# Patient Record
Sex: Female | Born: 1975 | Hispanic: Yes | Marital: Married | Smoking: Former smoker
Health system: Southern US, Community
[De-identification: ages and names within clinical notes are randomized; demographics above are authoritative.]

## PROBLEM LIST (undated history)

## (undated) ENCOUNTER — Inpatient Hospital Stay (HOSPITAL_COMMUNITY): Payer: Self-pay

## (undated) DIAGNOSIS — J45909 Unspecified asthma, uncomplicated: Secondary | ICD-10-CM

## (undated) DIAGNOSIS — O24419 Gestational diabetes mellitus in pregnancy, unspecified control: Secondary | ICD-10-CM

## (undated) DIAGNOSIS — E119 Type 2 diabetes mellitus without complications: Secondary | ICD-10-CM

## (undated) HISTORY — PX: NO PAST SURGERIES: SHX2092

---

## 2014-12-03 NOTE — L&D Delivery Note (Signed)
Delivery Note At 1:32 PM a viable female was delivered via  (Presentation: OA w/ compound hand, but unsure which one due to extremely rapid delivery). Moderate amount of bloody fluid passed w/ baby. Possible abruption. APGAR: 9, 9; weight pending.   Placenta status: spontaneous, intact.  Cord: 3VC with the following complications: None.  Cord pH: NA.  One moderate gush of blood after placenta. Firm w/ massage. Pitocin bolus infusing. Cytotec 1000 mcg placed rectally. FF. Scant bleeding.   Anesthesia: None Episiotomy: None Lacerations: Superficial abrasions. Hemostatic, no repair  Suture Repair: None Est. Blood Loss (mL): 306  Mom to postpartum.  Baby to Couplet care / Skin to Skin. Placenta to: BS Feeding: Breast Circ: No Contraception: Yes, but unsure what.  Sara Schwartz 05/03/2015, 2:11 PM

## 2014-12-22 ENCOUNTER — Inpatient Hospital Stay (HOSPITAL_COMMUNITY)
Admission: AD | Admit: 2014-12-22 | Discharge: 2014-12-22 | Disposition: A | Discharge status: Home / Self Care | Payer: Self-pay | Source: Ambulatory Visit | Attending: Obstetrics & Gynecology | Admitting: Obstetrics & Gynecology

## 2014-12-22 ENCOUNTER — Inpatient Hospital Stay (HOSPITAL_COMMUNITY): Payer: Self-pay

## 2014-12-22 ENCOUNTER — Encounter (HOSPITAL_COMMUNITY): Payer: Self-pay | Admitting: *Deleted

## 2014-12-22 DIAGNOSIS — Z3A20 20 weeks gestation of pregnancy: Secondary | ICD-10-CM | POA: Insufficient documentation

## 2014-12-22 DIAGNOSIS — Z87891 Personal history of nicotine dependence: Secondary | ICD-10-CM | POA: Insufficient documentation

## 2014-12-22 DIAGNOSIS — O9989 Other specified diseases and conditions complicating pregnancy, childbirth and the puerperium: Secondary | ICD-10-CM | POA: Insufficient documentation

## 2014-12-22 DIAGNOSIS — O26899 Other specified pregnancy related conditions, unspecified trimester: Secondary | ICD-10-CM | POA: Insufficient documentation

## 2014-12-22 DIAGNOSIS — R109 Unspecified abdominal pain: Secondary | ICD-10-CM | POA: Insufficient documentation

## 2014-12-22 DIAGNOSIS — M545 Low back pain: Secondary | ICD-10-CM | POA: Insufficient documentation

## 2014-12-22 DIAGNOSIS — N939 Abnormal uterine and vaginal bleeding, unspecified: Secondary | ICD-10-CM

## 2014-12-22 HISTORY — DX: Unspecified asthma, uncomplicated: J45.909

## 2014-12-22 LAB — URINE MICROSCOPIC-ADD ON

## 2014-12-22 LAB — URINALYSIS, ROUTINE W REFLEX MICROSCOPIC
Bilirubin Urine: NEGATIVE
Glucose, UA: NEGATIVE mg/dL
Hgb urine dipstick: NEGATIVE
Ketones, ur: NEGATIVE mg/dL
Nitrite: NEGATIVE
Protein, ur: NEGATIVE mg/dL
Specific Gravity, Urine: 1.01 (ref 1.005–1.030)
Urobilinogen, UA: 0.2 mg/dL (ref 0.0–1.0)
pH: 6.5 (ref 5.0–8.0)

## 2014-12-22 NOTE — MAU Provider Note (Signed)
History     CSN: [id number]  Arrival date and time: 12/22/14 1249   First Provider Initiated Contact with Patient 12/22/14 1359      Chief Complaint  Patient presents with  . Abdominal Pain  . Back Pain   HPI 39 yo female G7P5106 at 7442w2d presents with lower abdominal pain. Pain started last night. Pain begins in low back and radiates around lower abdomen. Admits to contractions q605mins. Denies strenuous activity or trauma.   Denies LOF, contractions, bleeding, decreased fetal activity.   Past Medical History  Diagnosis Date  . Asthma     Past Surgical History  Procedure Laterality Date  . Vaginal delivery      multiple    History reviewed. No pertinent family history.  History  Substance Use Topics  . Smoking status: Former Smoker    Quit date: 12/22/2012  . Smokeless tobacco: Not on file  . Alcohol Use: No    Allergies: No Known Allergies  Prescriptions prior to admission  Medication Sig Dispense Refill Last Dose  . albuterol (PROVENTIL HFA;VENTOLIN HFA) 108 (90 BASE) MCG/ACT inhaler Inhale 1-2 puffs into the lungs every 6 (six) hours as needed for wheezing or shortness of breath.   Past Week at Unknown time    Review of Systems  Constitutional: Negative for fever and chills.  Gastrointestinal: Positive for abdominal pain. Negative for nausea and vomiting.  Genitourinary: Negative for dysuria, urgency, frequency, hematuria and flank pain.  Neurological: Negative for headaches.   Physical Exam   Blood pressure 138/77, pulse 111, temperature 100 F (37.8 C), temperature source Oral, resp. rate 18.  Physical Exam  Constitutional: She is oriented to person, place, and time. She appears well-developed and well-nourished.  HENT:  Head: Normocephalic and atraumatic.  Neck: Neck supple.  Cardiovascular: Normal rate, regular rhythm and normal heart sounds.   Respiratory: Effort normal and breath sounds normal.  Musculoskeletal:  Tenderness to palpation of  bilateral SI joints  Neurological: She is alert and oriented to person, place, and time.    MAU Course  Procedures  Assessment and Plan  39 yo female G7P5106 at 1342w2d presents with low back and abdominal pain.  # low back pain that radiates to abdomen- considered pre-term labor because pt admits to contractions every 5 mins. However, physical exam is most consistent with msk low back pain. - U/S to examine placenta  # Dispo- discharge home with regular follow up  Rolm BookbinderMoss, Amber 12/22/2014, 1:59 PM   OB fellow attestation:  I have seen and examined this patient; I agree with above documentation in the resident's note.   Sara Schwartz is a 39 y.o. [phone] reporting lower abdominal pain, contractions q105min +FM, denies LOF, VB, contractions, vaginal discharge.  PE: BP 117/78 mmHg  Pulse 106  Temp(Src) 100 F (37.8 C) (Oral)  Resp 18 Gen: calm comfortable, NAD Resp: normal effort, no distress Abd: gravid Musculoskeletal: SI joints ttp Dilation: Closed Effacement (%): Thick No contractions during exam/interview  ROS, labs, PMH reviewed  Results for Sara LowerLOPEZ-CRUZ, Sara Schwartz (MRN [id number]) as of 01/05/2015 19:33  Ref. Range 12/22/2014 13:10  Color, Urine Latest Range: YELLOW  YELLOW  APPearance Latest Range: CLEAR  CLEAR  Specific Gravity, Urine Latest Range: 1.005-1.030  1.010  pH Latest Range: 5.0-8.0  6.5  Glucose Latest Range: NEGATIVE mg/dL NEGATIVE  Bilirubin Urine Latest Range: NEGATIVE  NEGATIVE  Ketones, ur Latest Range: NEGATIVE mg/dL NEGATIVE  Protein Latest Range: NEGATIVE mg/dL NEGATIVE  Urobilinogen, UA Latest Range: 0.0-1.0 mg/dL 0.2  Nitrite Latest Range: NEGATIVE  NEGATIVE  Leukocytes, UA Latest Range: NEGATIVE  TRACE (A)  Hgb urine dipstick Latest Range: NEGATIVE  NEGATIVE  WBC, UA Latest Range: <3 WBC/hpf 0-2  Squamous Epithelial / LPF Latest Range: RARE  FEW (A)  Bacteria, UA Latest Range: RARE  RARE    Plan: - pts complaints concerning for  preterm labor, however physical exam consistent with musculoskeletal lower back pain - CL 3.66 cm on sono, reassuring  Perry Mount, MD 7:31 PM

## 2014-12-22 NOTE — MAU Note (Signed)
States pain starts in lower back, then moves around to lower abd.

## 2014-12-22 NOTE — Progress Notes (Signed)
I assisted Susan, RN with questions.  Eda H Royal  Interpreter. °

## 2014-12-22 NOTE — MAU Note (Signed)
[facility]

## 2014-12-22 NOTE — Discharge Instructions (Signed)
Keep your scheduled appointment for prenatal care. °

## 2014-12-22 NOTE — MAU Note (Signed)
Uc's started last Friday but stopped, but started again last night.  Lower abd & back pain.  Denies bleeding or LOF.

## 2014-12-23 ENCOUNTER — Other Ambulatory Visit (HOSPITAL_COMMUNITY): Payer: Self-pay | Admitting: Nurse Practitioner

## 2014-12-23 DIAGNOSIS — O09522 Supervision of elderly multigravida, second trimester: Secondary | ICD-10-CM

## 2014-12-23 DIAGNOSIS — Z3689 Encounter for other specified antenatal screening: Secondary | ICD-10-CM

## 2014-12-23 LAB — GLUCOSE TOLERANCE, 1 HOUR (50G) W/O FASTING: Glucose, GTT - 1 Hour: 139 mg/dL (ref ?–200)

## 2014-12-23 LAB — OB RESULTS CONSOLE ABO/RH: RH TYPE: POSITIVE

## 2014-12-23 LAB — GC/CHLAMYDIA PROBE AMP (~~LOC~~) NOT AT ARMC
CHLAMYDIA, DNA PROBE: NEGATIVE
Neisseria Gonorrhea: NEGATIVE

## 2014-12-23 LAB — OB RESULTS CONSOLE HIV ANTIBODY (ROUTINE TESTING): HIV: NONREACTIVE

## 2014-12-23 LAB — CYSTIC FIBROSIS DIAGNOSTIC STUDY: INTERPRETATION-CFDNA: NEGATIVE

## 2014-12-23 LAB — OB RESULTS CONSOLE GC/CHLAMYDIA
CHLAMYDIA, DNA PROBE: NEGATIVE
Gonorrhea: NEGATIVE

## 2014-12-23 LAB — OB RESULTS CONSOLE VARICELLA ZOSTER ANTIBODY, IGG: Varicella: IMMUNE

## 2014-12-23 LAB — OB RESULTS CONSOLE RUBELLA ANTIBODY, IGM: RUBELLA: IMMUNE

## 2014-12-23 LAB — CULTURE, OB URINE

## 2014-12-23 LAB — CYTOLOGY - PAP

## 2014-12-23 LAB — SICKLE CELL SCREEN: SICKLE CELL SCREEN: NORMAL

## 2014-12-23 LAB — OB RESULTS CONSOLE ANTIBODY SCREEN: Antibody Screen: NEGATIVE

## 2014-12-23 LAB — OB RESULTS CONSOLE PLATELET COUNT: Platelets: 231 10*3/uL

## 2014-12-23 LAB — OB RESULTS CONSOLE HEPATITIS B SURFACE ANTIGEN: HEP B S AG: NEGATIVE

## 2014-12-23 LAB — OB RESULTS CONSOLE RPR: RPR: NONREACTIVE

## 2014-12-23 LAB — DRUG SCREEN, URINE: Drug Screen, Urine: NEGATIVE

## 2014-12-26 ENCOUNTER — Inpatient Hospital Stay (HOSPITAL_COMMUNITY)
Admission: AD | Admit: 2014-12-26 | Discharge: 2014-12-27 | Disposition: A | Discharge status: Home / Self Care | Payer: Self-pay | Source: Ambulatory Visit | Attending: Obstetrics and Gynecology | Admitting: Obstetrics and Gynecology

## 2014-12-26 ENCOUNTER — Encounter (HOSPITAL_COMMUNITY): Payer: Self-pay | Admitting: *Deleted

## 2014-12-26 DIAGNOSIS — R109 Unspecified abdominal pain: Secondary | ICD-10-CM | POA: Insufficient documentation

## 2014-12-26 DIAGNOSIS — O9989 Other specified diseases and conditions complicating pregnancy, childbirth and the puerperium: Secondary | ICD-10-CM | POA: Insufficient documentation

## 2014-12-26 DIAGNOSIS — M549 Dorsalgia, unspecified: Secondary | ICD-10-CM | POA: Insufficient documentation

## 2014-12-26 DIAGNOSIS — Z3A21 21 weeks gestation of pregnancy: Secondary | ICD-10-CM | POA: Insufficient documentation

## 2014-12-26 NOTE — MAU Note (Signed)
Pt reports that since 5pm she has had really bad back pain. Took tylenol without relief. States she is also having pressure in her lower abd.

## 2014-12-27 LAB — URINALYSIS, ROUTINE W REFLEX MICROSCOPIC
Bilirubin Urine: NEGATIVE
Glucose, UA: NEGATIVE mg/dL
Ketones, ur: NEGATIVE mg/dL
NITRITE: NEGATIVE
PH: 6 (ref 5.0–8.0)
Protein, ur: NEGATIVE mg/dL
Specific Gravity, Urine: <1.005 — ABNORMAL LOW (ref 1.005–1.030)
Urobilinogen, UA: 0.2 mg/dL (ref 0.0–1.0)

## 2014-12-27 LAB — URINE MICROSCOPIC-ADD ON

## 2014-12-27 MED ORDER — OXYCODONE-ACETAMINOPHEN 5-325 MG PO TABS: 1.0000 | ORAL_TABLET | Freq: Once | ORAL | Status: DC

## 2014-12-27 MED ORDER — OXYCODONE-ACETAMINOPHEN 5-325 MG PO TABS
2.0000 | ORAL_TABLET | Freq: Once | ORAL | Status: AC
  Administered 2014-12-27: 2 via ORAL
  Filled 2014-12-27: qty 2

## 2014-12-27 NOTE — MAU Note (Signed)
No s/s of adverse effects of medication. Patient discharging home with significant other.

## 2014-12-27 NOTE — MAU Provider Note (Signed)
Chief Complaint:  Back Pain and Abdominal Pain   First Provider Initiated Contact with Patient 12/27/14 0038      HPI: Sara Schwartz is a 39 y.o. G7P5106 at 1848w0d who presents to maternity admissions reporting low back and pelvic pain. Pain has been ongoing for approximately 7-10days it is progressive in nature. Localized to the lower quadrants and SI joints. Patient states that pain is becoming intolerable. She states she has experienced pain like this with prior pregnancies but not as severe and not as early in the pregnancy. Denies fevers, chills, n/v/d/c, weakness, numbness, bowel/bladder incontinence.  Denies contractions, leakage of fluid or vaginal bleeding.  Patient was seen on 12/22/14 for similar issues.  Past Medical History: Past Medical History  Diagnosis Date  . Asthma     Past obstetric history: OB History  Gravida Para Term Preterm AB SAB TAB Ectopic Multiple Living  7 6 5 1      6     # Outcome Date GA Lbr Len/2nd Weight Sex Delivery Anes PTL Lv  7 Current           6 Term           5 Term           4 Term           3 Term           2 Term           1 Preterm               Past Surgical History: Past Surgical History  Procedure Laterality Date  . Vaginal delivery      multiple     Family History: No family history on file.  Social History: History  Substance Use Topics  . Smoking status: Never Smoker   . Smokeless tobacco: Never Used  . Alcohol Use: No     Comment: socially when not pregnant    Allergies: No Known Allergies  Meds:  Prescriptions prior to admission  Medication Sig Dispense Refill Last Dose  . albuterol (PROVENTIL HFA;VENTOLIN HFA) 108 (90 BASE) MCG/ACT inhaler Inhale 1-2 puffs into the lungs every 6 (six) hours as needed for wheezing or shortness of breath.   Past Week at Unknown time    ROS: Pertinent findings in history of present illness.  Physical Exam  Blood pressure 129/71, pulse 98, temperature 98.3 F (36.8  C), temperature source Oral, resp. rate 16, height 5\' 4"  (1.626 m), weight 86.183 kg (190 lb), SpO2 100 %. GENERAL: Well-developed, well-nourished female in no acute distress.  HEENT: normocephalic, EOMI, PERRLA HEART: normal rate, rhythm RESP: normal effort, CTAB ABDOMEN: Soft, non-tender, gravid appropriate for gestational age; no significant TTP EXTREMITIES: Nontender, no edema; straight leg raise negative, sensation intact bilat, strength 5/5 bilat NEURO: alert and oriented  Dilation: Fingertip Effacement (%): Thick Cervical Position: Posterior Exam by:: Mckeag, MD   Labs: Results for orders placed or performed during the hospital encounter of 12/26/14 (from the past 24 hour(s))  Urinalysis, Routine w reflex microscopic     Status: Abnormal   Collection Time: 12/26/14 11:40 PM  Result Value Ref Range   Color, Urine YELLOW YELLOW   APPearance CLEAR CLEAR   Specific Gravity, Urine <1.005 (L) 1.005 - 1.030   pH 6.0 5.0 - 8.0   Glucose, UA NEGATIVE NEGATIVE mg/dL   Hgb urine dipstick TRACE (A) NEGATIVE   Bilirubin Urine NEGATIVE NEGATIVE   Ketones, ur NEGATIVE NEGATIVE mg/dL  Protein, ur NEGATIVE NEGATIVE mg/dL   Urobilinogen, UA 0.2 0.0 - 1.0 mg/dL   Nitrite NEGATIVE NEGATIVE   Leukocytes, UA MODERATE (A) NEGATIVE  Urine microscopic-add on     Status: Abnormal   Collection Time: 12/26/14 11:40 PM  Result Value Ref Range   Squamous Epithelial / LPF FEW (A) RARE   WBC, UA 7-10 <3 WBC/hpf   RBC / HPF 3-6 <3 RBC/hpf   Bacteria, UA FEW (A) RARE    Imaging:  US Ob Limited  12/22/2014   OBSTETRICAL ULTRASOUND: This exam was performed within a [place] [facility]. The OB US report was generated in the AS system, and faxed to the ordering physician.   This report is available in the [employer]. See the AS Obstetric US report via the Image Link.  Assessment: Likely Ligamental stretching 2/2 pregnancy, could be multifactorial w/ muscle strain. Differential includes  muscle strain, sciatica, constipation. 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 Sciatica unlikely due to negative straight leg raise and no pain down her legs whatsoever. Constipation is unlikely due to the patient reporting having a BM earlier this morning.   Plan: Percocet 5/325 x10tablets for pain Discharge home Labor precautions and fetal kick counts F/u w/ OBGYN    Medication List    ASK your doctor about these medications        albuterol 108 (90 BASE) MCG/ACT inhaler  Commonly known as:  PROVENTIL HFA;VENTOLIN HFA  Inhale 1-2 puffs into the lungs every 6 (six) hours as needed for wheezing or shortness of breath.        Kathee Delton, MD 12/27/2014 1:01 AM

## 2014-12-29 ENCOUNTER — Ambulatory Visit (HOSPITAL_COMMUNITY)
Admission: RE | Admit: 2014-12-29 | Discharge: 2014-12-29 | Disposition: A | Discharge status: Home / Self Care | Payer: Self-pay | Source: Ambulatory Visit | Attending: Nurse Practitioner | Admitting: Nurse Practitioner

## 2014-12-29 DIAGNOSIS — Z36 Encounter for antenatal screening of mother: Secondary | ICD-10-CM | POA: Insufficient documentation

## 2014-12-29 DIAGNOSIS — Z3689 Encounter for other specified antenatal screening: Secondary | ICD-10-CM | POA: Insufficient documentation

## 2014-12-29 DIAGNOSIS — O09529 Supervision of elderly multigravida, unspecified trimester: Secondary | ICD-10-CM | POA: Insufficient documentation

## 2014-12-29 DIAGNOSIS — O09212 Supervision of pregnancy with history of pre-term labor, second trimester: Secondary | ICD-10-CM | POA: Insufficient documentation

## 2014-12-29 DIAGNOSIS — O24410 Gestational diabetes mellitus in pregnancy, diet controlled: Secondary | ICD-10-CM | POA: Insufficient documentation

## 2014-12-29 DIAGNOSIS — O09522 Supervision of elderly multigravida, second trimester: Secondary | ICD-10-CM | POA: Insufficient documentation

## 2014-12-29 DIAGNOSIS — Z3A21 21 weeks gestation of pregnancy: Secondary | ICD-10-CM | POA: Insufficient documentation

## 2014-12-29 LAB — GLUCOSE, 3 HOUR GESTATIONAL
GLUCOSE 1 HOUR GTT: 175 mg/dL (ref ?–200)
GLUCOSE 2 HOUR GTT: 167 mg/dL — AB (ref ?–140)
GLUCOSE 3 HOUR GTT: 137 mg/dL (ref ?–140)
GLUCOSE FASTING: 115 mg/dL — AB (ref 60–109)

## 2015-01-03 ENCOUNTER — Telehealth: Payer: Self-pay

## 2015-01-03 NOTE — Telephone Encounter (Signed)
-----   Message from Perry MountKristy Rocio Acosta, MD sent at 12/31/2014  8:36 PM EST ----- Does this patient receive prenatal care somewhere?  Got result of sono in my inbasket but I am not sure where she is getting her care.  She needs anatomy sono and to establish care.  Perry MountACOSTA,KRISTY ROCIO, MD     ----- Message -----    From: Rad Results In Interface    Sent: 12/22/2014   4:35 PM      To: Perry MountKristy Rocio Acosta, MD

## 2015-01-03 NOTE — Telephone Encounter (Signed)
Patient has initial visit with Sara Schwartz on 01/10/14 at 0805-- [facility] Called patient with spanish interpreter Laveda NormanBlanca Linder to inform her of appointment date, time and location with Sara Schwartz. Patient verbalized understanding and gratitude. No questions or concerns.

## 2015-01-10 ENCOUNTER — Encounter: Payer: Self-pay | Admitting: Obstetrics and Gynecology

## 2015-01-10 ENCOUNTER — Encounter: Payer: Self-pay | Attending: Obstetrics and Gynecology | Admitting: *Deleted

## 2015-01-10 ENCOUNTER — Ambulatory Visit (INDEPENDENT_AMBULATORY_CARE_PROVIDER_SITE_OTHER): Payer: Self-pay | Admitting: Obstetrics and Gynecology

## 2015-01-10 VITALS — BP 110/62 | HR 79 | Temp 97.3°F | Wt 184.6 lb

## 2015-01-10 DIAGNOSIS — O09529 Supervision of elderly multigravida, unspecified trimester: Secondary | ICD-10-CM | POA: Insufficient documentation

## 2015-01-10 DIAGNOSIS — O09892 Supervision of other high risk pregnancies, second trimester: Secondary | ICD-10-CM

## 2015-01-10 DIAGNOSIS — IMO0001 Reserved for inherently not codable concepts without codable children: Secondary | ICD-10-CM

## 2015-01-10 DIAGNOSIS — O24419 Gestational diabetes mellitus in pregnancy, unspecified control: Secondary | ICD-10-CM

## 2015-01-10 DIAGNOSIS — O09219 Supervision of pregnancy with history of pre-term labor, unspecified trimester: Secondary | ICD-10-CM

## 2015-01-10 DIAGNOSIS — N870 Mild cervical dysplasia: Secondary | ICD-10-CM | POA: Insufficient documentation

## 2015-01-10 DIAGNOSIS — Z713 Dietary counseling and surveillance: Secondary | ICD-10-CM | POA: Insufficient documentation

## 2015-01-10 DIAGNOSIS — O3442 Maternal care for other abnormalities of cervix, second trimester: Secondary | ICD-10-CM

## 2015-01-10 DIAGNOSIS — J4520 Mild intermittent asthma, uncomplicated: Secondary | ICD-10-CM

## 2015-01-10 DIAGNOSIS — O09522 Supervision of elderly multigravida, second trimester: Secondary | ICD-10-CM

## 2015-01-10 DIAGNOSIS — O99519 Diseases of the respiratory system complicating pregnancy, unspecified trimester: Secondary | ICD-10-CM

## 2015-01-10 DIAGNOSIS — O9989 Other specified diseases and conditions complicating pregnancy, childbirth and the puerperium: Secondary | ICD-10-CM

## 2015-01-10 DIAGNOSIS — J45909 Unspecified asthma, uncomplicated: Secondary | ICD-10-CM

## 2015-01-10 DIAGNOSIS — O09212 Supervision of pregnancy with history of pre-term labor, second trimester: Secondary | ICD-10-CM

## 2015-01-10 DIAGNOSIS — O09899 Supervision of other high risk pregnancies, unspecified trimester: Secondary | ICD-10-CM | POA: Insufficient documentation

## 2015-01-10 DIAGNOSIS — R87619 Unspecified abnormal cytological findings in specimens from cervix uteri: Secondary | ICD-10-CM

## 2015-01-10 LAB — CBC
HCT: 30.8 % — ABNORMAL LOW (ref 36.0–46.0)
Hemoglobin: 9.6 g/dL — ABNORMAL LOW (ref 12.0–15.0)
MCH: 22.6 pg — ABNORMAL LOW (ref 26.0–34.0)
MCHC: 31.2 g/dL (ref 30.0–36.0)
MCV: 72.5 fL — ABNORMAL LOW (ref 78.0–100.0)
MPV: 9.3 fL (ref 8.6–12.4)
Platelets: 393 10*3/uL (ref 150–400)
RBC: 4.25 MIL/uL (ref 3.87–5.11)
RDW: 19 % — ABNORMAL HIGH (ref 11.5–15.5)
WBC: 8.9 10*3/uL (ref 4.0–10.5)

## 2015-01-10 LAB — POCT URINALYSIS DIP (DEVICE)
Bilirubin Urine: NEGATIVE
Glucose, UA: NEGATIVE mg/dL
Ketones, ur: NEGATIVE mg/dL
NITRITE: NEGATIVE
PROTEIN: NEGATIVE mg/dL
SPECIFIC GRAVITY, URINE: 1.02 (ref 1.005–1.030)
UROBILINOGEN UA: 0.2 mg/dL (ref 0.0–1.0)
pH: 7.5 (ref 5.0–8.0)

## 2015-01-10 MED ORDER — ACCU-CHEK FASTCLIX LANCETS MISC: 1.0000 [IU] | Freq: Four times a day (QID) | Status: DC

## 2015-01-10 MED ORDER — ASPIRIN 81 MG PO TABS: 81.0000 mg | ORAL_TABLET | Freq: Every day | ORAL | Status: DC

## 2015-01-10 MED ORDER — GLUCOSE BLOOD VI STRP: ORAL_STRIP | Status: DC

## 2015-01-10 MED ORDER — PRENATAL VITAMINS 0.8 MG PO TABS: 1.0000 | ORAL_TABLET | Freq: Every day | ORAL | Status: DC

## 2015-01-10 MED ORDER — ACCU-CHEK NANO SMARTVIEW W/DEVICE KIT: 1.0000 | PACK | Freq: Four times a day (QID) | Status: DC

## 2015-01-10 NOTE — Progress Notes (Signed)
  Patient was seen on 01/10/15 for Gestational Diabetes self-management . The following learning objectives were met by the patient : She will meet with Dietitian next visit. She needs to be at work by 10:00.   States the definition of Gestational Diabetes  States why dietary management is important in controlling blood glucose  Describes the effects of carbohydrates on blood glucose levels  Demonstrates carbohydrate counting   States when to check blood glucose levels  Demonstrates proper blood glucose monitoring techniques  States the effect of stress and exercise on blood glucose levels  States the importance of limiting caffeine and abstaining from alcohol and smoking  Plan:  Be aware of the carbohydrates you are eating. Eat about 1/2 of the beans, rice and tortilla that she normally eats. Consider  increasing your activity level by walking daily as tolerated Begin checking BG before breakfast and 2 hours after first bit of breakfast, lunch and dinner after  as directed by MD  Take medication  as directed by MD  Blood glucose monitor given: [employer] Lot # [id number] Exp: 2017/01/30 Blood glucose reading: 132m/dl  Patient instructed to monitor glucose levels: FBS: 60 - <90 2 hour: <120  Patient received the following handouts:  Nutrition Diabetes and Pregnancy  Carbohydrate Counting List  Patient will be seen for follow-up as needed.

## 2015-01-10 NOTE — Addendum Note (Signed)
Addended by: Rosendo GrosHALPIN, Lener Ventresca L on: 01/10/2015 09:48 AM   Modules accepted: Orders

## 2015-01-10 NOTE — Addendum Note (Signed)
Addended by: Catalina AntiguaONSTANT, [place] Phifer on: 01/10/2015 10:13 AM   Modules accepted: Orders

## 2015-01-10 NOTE — Progress Notes (Signed)
Growth U/S 01/24/15 @ 115p with Radiology.  [facility] app[facility] 02/03/15 @ 830a pt aware $115.00 due at time of appt.  Fetal Echo with Dr. Elizebeth Brookingotton 02/03/15 @ 1030a, pt aware $100.00 due at time of appt.  Spanish interpreter Laveda NormanBlanca Linder present.

## 2015-01-10 NOTE — Progress Notes (Signed)
Reports decreased appetite Okey RegalCarol used for interpreter Needs Rx for PNV and needs testing supplies

## 2015-01-10 NOTE — Progress Notes (Signed)
Patient transferred from HD secondary to gestational diabetes and h/o preterm delivery. Discussed the meaning of GDM and its potential complications in pregnancy including but not limited to risks of fetal macrosomia, associated labor dystocia, and potential for fetal death. Will send to diabetic educator for teaching. Obtain baseline labs and refer for fetal echo and eye exam Patient [place] h/o preterm [place] at 2534, 29 and 36 weeks. Discussed benefits of 17-P in decreasing risks of PTD- Patient desires to discuss with her husband Patient with abnormal pap smear- LSIL- will schedule for colposcopy

## 2015-01-12 ENCOUNTER — Encounter: Payer: Self-pay | Admitting: *Deleted

## 2015-01-12 DIAGNOSIS — Z283 Underimmunization status: Secondary | ICD-10-CM | POA: Insufficient documentation

## 2015-01-12 DIAGNOSIS — Z2839 Other underimmunization status: Secondary | ICD-10-CM | POA: Insufficient documentation

## 2015-01-12 DIAGNOSIS — O0932 Supervision of pregnancy with insufficient antenatal care, second trimester: Secondary | ICD-10-CM

## 2015-01-12 DIAGNOSIS — Z23 Encounter for immunization: Secondary | ICD-10-CM | POA: Insufficient documentation

## 2015-01-12 DIAGNOSIS — Z789 Other specified health status: Secondary | ICD-10-CM | POA: Insufficient documentation

## 2015-01-12 DIAGNOSIS — O0930 Supervision of pregnancy with insufficient antenatal care, unspecified trimester: Secondary | ICD-10-CM | POA: Insufficient documentation

## 2015-01-12 DIAGNOSIS — O9989 Other specified diseases and conditions complicating pregnancy, childbirth and the puerperium: Secondary | ICD-10-CM

## 2015-01-13 ENCOUNTER — Encounter: Payer: Self-pay | Admitting: Obstetrics & Gynecology

## 2015-01-17 ENCOUNTER — Ambulatory Visit: Payer: Self-pay

## 2015-01-24 ENCOUNTER — Ambulatory Visit (HOSPITAL_COMMUNITY): Payer: Self-pay

## 2015-01-24 ENCOUNTER — Encounter: Payer: Self-pay | Admitting: Obstetrics & Gynecology

## 2015-01-26 ENCOUNTER — Ambulatory Visit: Payer: Self-pay

## 2015-01-26 ENCOUNTER — Ambulatory Visit (HOSPITAL_COMMUNITY)
Admission: RE | Admit: 2015-01-26 | Discharge: 2015-01-26 | Disposition: A | Discharge status: Home / Self Care | Payer: Self-pay | Source: Ambulatory Visit | Attending: Obstetrics and Gynecology | Admitting: Obstetrics and Gynecology

## 2015-01-26 DIAGNOSIS — O09522 Supervision of elderly multigravida, second trimester: Secondary | ICD-10-CM | POA: Insufficient documentation

## 2015-01-26 DIAGNOSIS — Z3A25 25 weeks gestation of pregnancy: Secondary | ICD-10-CM | POA: Insufficient documentation

## 2015-01-26 DIAGNOSIS — O24410 Gestational diabetes mellitus in pregnancy, diet controlled: Secondary | ICD-10-CM | POA: Insufficient documentation

## 2015-01-31 ENCOUNTER — Telehealth: Payer: Self-pay | Admitting: *Deleted

## 2015-01-31 NOTE — Telephone Encounter (Addendum)
Pt called and stated that she now has medicaid and needs a meter and strips and lancets sent to her pharmacy [facility]

## 2015-01-31 NOTE — Telephone Encounter (Signed)
Called patient with pacific interpreter [phone], no answer. Left message stating we are trying to return your call and to let you know we have already sent in that prescription to your pharmacy. Please call us back at the clinics if you have any questions or problems getting your prescription

## 2015-02-01 ENCOUNTER — Other Ambulatory Visit: Payer: Self-pay

## 2015-02-01 MED ORDER — GLUCOSE BLOOD VI STRP: ORAL_STRIP | Status: DC

## 2015-02-01 NOTE — Progress Notes (Signed)
Pt called the front desk and stated to Beronica  that she went to go pick her meter and was told that Medicaid does not cover the meter that was ordered.  I called pt [facility] pharmacy and spoke with pharmacy tech and clarified the prescription that was ordered.  Pharmacy tech stated that the correct meter was ordered but there was no instructions for the test strips.  I informed tech that I would resubmit the order for the test strips and that the pt will bring in with her the new Medicaid card.  Beronica informed pt to bring Medicaid card to pharmacy to pick up her meter and test strips.  Pt agreed.

## 2015-02-08 ENCOUNTER — Telehealth: Payer: Self-pay

## 2015-02-08 ENCOUNTER — Encounter (HOSPITAL_COMMUNITY): Payer: Self-pay | Admitting: General Practice

## 2015-02-08 ENCOUNTER — Inpatient Hospital Stay (HOSPITAL_COMMUNITY)
Admission: AD | Admit: 2015-02-08 | Discharge: 2015-02-08 | Disposition: A | Discharge status: Home / Self Care | Payer: Self-pay | Source: Ambulatory Visit | Attending: Family Medicine | Admitting: Family Medicine

## 2015-02-08 DIAGNOSIS — K047 Periapical abscess without sinus: Secondary | ICD-10-CM | POA: Insufficient documentation

## 2015-02-08 DIAGNOSIS — Z3A27 27 weeks gestation of pregnancy: Secondary | ICD-10-CM | POA: Insufficient documentation

## 2015-02-08 DIAGNOSIS — O9989 Other specified diseases and conditions complicating pregnancy, childbirth and the puerperium: Secondary | ICD-10-CM | POA: Insufficient documentation

## 2015-02-08 HISTORY — DX: Type 2 diabetes mellitus without complications: E11.9

## 2015-02-08 HISTORY — DX: Gestational diabetes mellitus in pregnancy, unspecified control: O24.419

## 2015-02-08 MED ORDER — AMOXICILLIN-POT CLAVULANATE 875-125 MG PO TABS: 1.0000 | ORAL_TABLET | Freq: Two times a day (BID) | ORAL | Status: DC

## 2015-02-08 MED ORDER — ACETAMINOPHEN-CODEINE #3 300-30 MG PO TABS: 1.0000 | ORAL_TABLET | Freq: Four times a day (QID) | ORAL | Status: DC | PRN

## 2015-02-08 MED ORDER — LACTATED RINGERS IV BOLUS (SEPSIS)
1000.0000 mL | Freq: Once | INTRAVENOUS | Status: AC
  Administered 2015-02-08: 1000 mL via INTRAVENOUS

## 2015-02-08 MED ORDER — OXYCODONE-ACETAMINOPHEN 5-325 MG PO TABS: 1.0000 | ORAL_TABLET | Freq: Four times a day (QID) | ORAL | Status: DC | PRN

## 2015-02-08 MED ORDER — SODIUM CHLORIDE 0.9 % IV SOLN
3.0000 g | Freq: Four times a day (QID) | INTRAVENOUS | Status: DC
  Administered 2015-02-08: 3 g via INTRAVENOUS
  Filled 2015-02-08 (×3): qty 3

## 2015-02-08 NOTE — Telephone Encounter (Signed)
Patient walked into clinic today c/o of tooth ache and swelling. Upon inspection patient's right cheek very swollen--. Called [facility]-- they stated given patient's swelling they recommend she get a dose of IV Abx as they would not be able to do any work until swelling decreased. They state they can fit her in for an emergency visit tomorrow at 0930 02/09/15. Patient needs to call them to secure appointment ASAP-- also state she should arrive at 0800 tomorrow as they may be able to see her sooner. Discussed with Caren Griffinseirdre Poe, CNM who recomends patient be sent to MAU for dose of IB ABX. Report called to RN. Deirdre Christy GentlesPoe, CNM called report to resident. Patient walked up to MAU. Patient also given appointment information for tomorrow. Patient verbalized understanding and gratitude. No questions or concerns.

## 2015-02-08 NOTE — Discharge Instructions (Signed)
Absceso dental  (Abscessed Tooth)  Un absceso dental es una infeccin alrededor de un diente. Las causas pueden ser cavidades o lesiones en un diente (caries) o una enfermedad dental. El absceso dental causa dolor alrededor del diente que puede ser leve o intenso. Consulte al dentista inmediatamente si tiene dolor en un diente o en la enca.  CUIDADOS EN EL HOGAR   Tome los medicamentos segn las indicaciones. Finalice la prescripcin completa, aunque se sienta mejor.  No conduzca automviles luego de tomar analgsicos.  Enjuguese la boca con frecuencia (haga buches) con agua y sal ( de cucharadita de sal en 8 onzas [240 ml] de agua tibia).  No aplique calor en la parte externa del rostro. SOLICITE AYUDA DE INMEDIATO SI:   La temperatura oral le sube a ms de 38,9 C (102 F) y no puede controlarla con medicamentos.  Siente escalofros o un dolor de cabeza muy intenso.  Tiene problemas para respirar o tragar.  No puede abrir Government social research officerla boca.  Observa inflamacin (hinchazn) en el cuello o alrededor del ojo.  El dolor no se alivia con los [employer] Parcelmedicamentos.  El dolor empeora en vez de [place]jorar. ASEGRESE DE QUE:   Comprende estas instrucciones.  Controlar su enfermedad.  Solicitar ayuda de inmediato si no mejora o si empeora. Document Released: 08/13/2012 [facility] Patient Information 2015 [place] [place]. This information is not intended to replace advice given to you by your health care provider. Make sure you discuss any questions you have with your health care provider.

## 2015-02-08 NOTE — MAU Note (Signed)
Pt sent to MAU to receive IV antibiotics, has tooth abscess on L top.

## 2015-02-08 NOTE — MAU Provider Note (Signed)
History     CSN: [id number]  Arrival date and time: 02/08/15 2094  Patient contact at 11:40am 02/08/15.    Chief Complaint  Patient presents with  . tooth abscess    HPI Sara Schwartz is a 39yo female [phone] at 27weeks and 1 day presenting for dental abscess.  Initially presented to clinic with tooth ache and swelling. Dentist called and recommended dose of IV antibiotics and will arrange for emergency visit on 02/09/15 at 9:30am. Was instructed to come to MAU for IV antibiotics. States pain and swelling started yesterday. Pain located on right side of jaw. Has tried Tylenol without relief. Has difficulty eating and has been chewing on left side. Denies fevers. States she has called dentist and arranged for appointment tomorrow as instructed at clinic. States it has been a long time since last visit to dentist. Continues to note fetal movement. Denies contractions. Denies vaginal bleeding or discharge.  OB History    Gravida Para Term Preterm AB TAB SAB Ectopic Multiple Living   '7 6 3 3 ' 0 0 0 0 0 6      Past Medical History  Diagnosis Date  . Asthma     Past Surgical History  Procedure Laterality Date  . Vaginal delivery      multiple  . No past surgeries      Family History  Problem Relation Age of Onset  . Diabetes Mother   . Hypertension Mother   . Diabetes Father   . Hypertension Father     History  Substance Use Topics  . Smoking status: Never Smoker   . Smokeless tobacco: Never Used  . Alcohol Use: No     Comment: socially when not pregnant    Allergies: No Known Allergies  Prescriptions prior to admission  Medication Sig Dispense Refill Last Dose  . ACCU-CHEK FASTCLIX LANCETS MISC 1 Units by Does not apply route 4 (four) times daily. 102 each 3   . albuterol (PROVENTIL HFA;VENTOLIN HFA) 108 (90 BASE) MCG/ACT inhaler Inhale 1-2 puffs into the lungs every 6 (six) hours as needed for wheezing or shortness of breath.   Taking  . aspirin 81 MG tablet Take 1  tablet (81 mg total) by mouth daily. 30 tablet 6   . Blood Glucose Monitoring Suppl (ACCU-CHEK NANO SMARTVIEW) W/DEVICE KIT 1 Device by Does not apply route 4 (four) times daily. 1 kit 0   . glucose blood (ACCU-CHEK SMARTVIEW) test strip Use as instructed 50 each 12   . Prenatal Multivit-Min-Fe-FA (PRENATAL VITAMINS) 0.8 MG tablet Take 1 tablet by mouth daily. 30 tablet 12     Review of Systems  All other systems reviewed and are negative.  Physical Exam   Blood pressure 123/73, pulse 84, temperature 98.7 F (37.1 C), temperature source Oral, resp. rate 18, last menstrual period 08/02/2014.  Physical Exam  Constitutional: She appears well-developed and well-nourished. No distress.  HENT:  Significant swelling noted over right cheek. Abscess palpable over right jaw, tender with palpation, no erythema or warmth noted. Dental caries noted.  Neck: Neck supple.  Cardiovascular: Normal rate and regular rhythm.  Exam reveals no gallop and no friction rub.   No murmur heard. Respiratory: Effort normal. No respiratory distress. She has no wheezes. She has no rales.  Musculoskeletal: She exhibits no edema.  Lymphadenopathy:    She has no cervical adenopathy.   MAU Course  Procedures  MDM Fetal Doppler: 153bpm IV Unasyn  Assessment and Plan  Stable for discharge. Follow up  with dentist on 02/09/15 as instructed. Discharged with Tylenol #3 for pain and 10 day course of Augmentin.  Lorna Few 02/08/2015, 11:38 AM   OB fellow attestation:  I have seen and examined this patient; I agree with above documentation in the resident's note.   Sara Schwartz is a 39 y.o. [phone] reporting for dose of IV abx 2/2 right tooth abscess.  Referred to [place] by dentist for dose of IV abx  PE: BP 121/61 mmHg  Pulse 79  Temp(Src) 98.7 F (37.1 C) (Oral)  Resp 18  LMP 08/02/2014 Gen: calm comfortable, NAD Resp: normal effort, no distress Abd: gravid  ROS, labs, PMH reviewed unasyn x  1  Plan: - advised to go to dentist tomorrow (has appt), start abx, pain meds rx'd, return for fever or worsening of symptoms  Sara Judon ROCIO, MD 5:03 PM

## 2015-02-16 ENCOUNTER — Other Ambulatory Visit (HOSPITAL_COMMUNITY)
Admission: RE | Admit: 2015-02-16 | Discharge: 2015-02-16 | Disposition: A | Discharge status: Home / Self Care | Payer: Self-pay | Source: Ambulatory Visit | Attending: Family Medicine | Admitting: Family Medicine

## 2015-02-16 ENCOUNTER — Ambulatory Visit (INDEPENDENT_AMBULATORY_CARE_PROVIDER_SITE_OTHER): Payer: Self-pay | Admitting: Family Medicine

## 2015-02-16 VITALS — BP 107/49 | HR 73 | Temp 98.3°F | Wt 188.3 lb

## 2015-02-16 DIAGNOSIS — R87612 Low grade squamous intraepithelial lesion on cytologic smear of cervix (LGSIL): Secondary | ICD-10-CM

## 2015-02-16 DIAGNOSIS — Z23 Encounter for immunization: Secondary | ICD-10-CM

## 2015-02-16 DIAGNOSIS — R87619 Unspecified abnormal cytological findings in specimens from cervix uteri: Secondary | ICD-10-CM

## 2015-02-16 DIAGNOSIS — O3442 Maternal care for other abnormalities of cervix, second trimester: Secondary | ICD-10-CM

## 2015-02-16 DIAGNOSIS — IMO0001 Reserved for inherently not codable concepts without codable children: Secondary | ICD-10-CM

## 2015-02-16 DIAGNOSIS — O24419 Gestational diabetes mellitus in pregnancy, unspecified control: Secondary | ICD-10-CM

## 2015-02-16 DIAGNOSIS — O09522 Supervision of elderly multigravida, second trimester: Secondary | ICD-10-CM

## 2015-02-16 LAB — CBC
HCT: 30 % — ABNORMAL LOW (ref 36.0–46.0)
Hemoglobin: 9.5 g/dL — ABNORMAL LOW (ref 12.0–15.0)
MCH: 23.2 pg — ABNORMAL LOW (ref 26.0–34.0)
MCHC: 31.7 g/dL (ref 30.0–36.0)
MCV: 73.2 fL — ABNORMAL LOW (ref 78.0–100.0)
MPV: 9.8 fL (ref 8.6–12.4)
PLATELETS: 274 10*3/uL (ref 150–400)
RBC: 4.1 MIL/uL (ref 3.87–5.11)
RDW: 19.3 % — AB (ref 11.5–15.5)
WBC: 9.5 10*3/uL (ref 4.0–10.5)

## 2015-02-16 LAB — POCT URINALYSIS DIP (DEVICE)
Bilirubin Urine: NEGATIVE
Glucose, UA: NEGATIVE mg/dL
Ketones, ur: NEGATIVE mg/dL
Nitrite: NEGATIVE
PROTEIN: NEGATIVE mg/dL
Specific Gravity, Urine: 1.025 (ref 1.005–1.030)
UROBILINOGEN UA: 0.2 mg/dL (ref 0.0–1.0)
pH: 6 (ref 5.0–8.0)

## 2015-02-16 LAB — RPR

## 2015-02-16 MED ORDER — GLYBURIDE 2.5 MG PO TABS: 2.5000 mg | ORAL_TABLET | Freq: Every day | ORAL | Status: DC

## 2015-02-16 MED ORDER — TETANUS-DIPHTH-ACELL PERTUSSIS 5-2.5-18.5 LF-MCG/0.5 IM SUSP
0.5000 mL | Freq: Once | INTRAMUSCULAR | Status: AC
  Administered 2015-02-16: 0.5 mL via INTRAMUSCULAR

## 2015-02-16 NOTE — Progress Notes (Signed)
C/o scant bleeding " orangey red" once last night when urinated. Also c/o back pain .Used Interpreter Sara HongMariaElena Jimenez

## 2015-02-16 NOTE — Addendum Note (Signed)
Addended by: Levie HeritageSTINSON, JACOB J on: 02/16/2015 03:45 PM   Modules accepted: Orders

## 2015-02-16 NOTE — Progress Notes (Signed)
Fasting: 94-122 Postprandial: 92-166 (13:21 elevated) Will start Glyburide 2.5mg  in PM to help control fasting blood sugar.  Will likely help with PP blood sugars. Recheck US for growth next week Had spotting last night on toilet paper Colposcopy done for LSIL Acetowhite at 12 o'clock with abnormal vasculature.  Biopsy taken.   SCJ seen 360 degrees No ECC due to pregnancy.  Monsels used to abate bleeding.  Discussed care afterwards.  Return for bleeding, cramping, contractions.

## 2015-02-16 NOTE — Patient Instructions (Signed)
Colposcopa - Cuidados posteriores (Colposcopy, Care After) Siga estas instrucciones durante las prximas semanas. Estas indicaciones le proporcionan informacin general acerca de cmo deber cuidarse despus del procedimiento. El mdico tambin podr darle instrucciones ms especficas. El tratamiento se ha planificado de acuerdo a las prcticas mdicas actuales, pero a veces se producen problemas. Comunquese con el mdico si tiene algn problema o tiene dudas despus del procedimiento. QU ESPERAR DESPUS DEL PROCEDIMIENTO  Despus del procedimiento, es tpico tener las siguientes sensaciones:  Clicos. Generalmente se calman en algunos minutos.  Dolor. Puede durar hasta dos das.  Aturdimiento. Si esto le ocurre, recustese durante algunos minutos. Podr tener un sangrado leve o una secrecin oscura que debe detenerse en algunos das. Durante este tiempo deber usar un apsito sanitario. INSTRUCCIONES PARA EL CUIDADO EN EL HOGAR  Evite las relaciones sexuales, las duchas vaginales y el uso de tampones durante 3 das, o segn lo que le indique su mdico.  Tome slo medicamentos de venta libre o recetados, segn las indicaciones del mdico. No tome aspirina, ya que puede causar hemorragias.  Si utiliza pldoras anticonceptivas, contine tomndolas.  No todos los resultados estarn disponibles durante su visita. En este caso, tenga otra entrevista con su mdico para conocerlos. No suponga que es normal si no tiene noticias de su mdico o del establecimiento de salud. Es importante el seguimiento de todos los resultados de los estudios.  Siga los consejos de su mdico con respecto a los medicamentos, actividades, visitas y [place] de control. SOLICITE ATENCIN MDICA SI:  Aparece una erupcin cutnea.  Tiene problemas con los medicamentos. SOLICITE ATENCIN MDICA DE INMEDIATO SI:  Tiene una hemorragia abundante o elimina cogulos.  Tiene fiebre.  Tiene flujo vaginal  anormal.  Tiene clicos que no se alivian luego de tomar analgsicos.  Se siente mareada, tiene vahdos o se desmaya.  Siente dolor en el estmago. Document Released: 09/09/2013 ExitCare Patient Information 2015 [employer]. This information is not intended to replace advice given to you by your health care provider. Make sure you discuss any questions you have with your health care provider.  

## 2015-02-17 LAB — HIV ANTIBODY (ROUTINE TESTING W REFLEX): HIV 1&2 Ab, 4th Generation: NONREACTIVE

## 2015-02-23 ENCOUNTER — Ambulatory Visit (HOSPITAL_COMMUNITY)
Admission: RE | Admit: 2015-02-23 | Discharge: 2015-02-23 | Disposition: A | Discharge status: Home / Self Care | Payer: Self-pay | Source: Ambulatory Visit | Attending: Family Medicine | Admitting: Family Medicine

## 2015-02-23 DIAGNOSIS — O09522 Supervision of elderly multigravida, second trimester: Secondary | ICD-10-CM

## 2015-02-23 DIAGNOSIS — O24419 Gestational diabetes mellitus in pregnancy, unspecified control: Secondary | ICD-10-CM | POA: Insufficient documentation

## 2015-02-23 DIAGNOSIS — Z3A29 29 weeks gestation of pregnancy: Secondary | ICD-10-CM | POA: Insufficient documentation

## 2015-02-28 ENCOUNTER — Encounter: Payer: Self-pay | Attending: Obstetrics and Gynecology | Admitting: *Deleted

## 2015-02-28 ENCOUNTER — Ambulatory Visit: Payer: Self-pay | Admitting: *Deleted

## 2015-02-28 ENCOUNTER — Telehealth: Payer: Self-pay

## 2015-02-28 VITALS — Ht 62.75 in | Wt 188.4 lb

## 2015-02-28 DIAGNOSIS — Z713 Dietary counseling and surveillance: Secondary | ICD-10-CM | POA: Insufficient documentation

## 2015-02-28 DIAGNOSIS — O24419 Gestational diabetes mellitus in pregnancy, unspecified control: Secondary | ICD-10-CM | POA: Insufficient documentation

## 2015-02-28 NOTE — Telephone Encounter (Signed)
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  Please let pt know.  She will need PAP with cytology in 1 year.

## 2015-02-28 NOTE — Telephone Encounter (Signed)
[facility] interpreter [phone]#[id number]. Informed her of results and recommendations-- advised if she does not have insurance she may return to health department to have pap smear where she had it done before and explained that if abnormal they will refer her again-- rienforced the importance of follow up. Patient verbalized understanding and gratitude. No questions or concerns.

## 2015-02-28 NOTE — Progress Notes (Signed)
In review of glucose readings I note: FBS 62-93mg /dl, 2hpp dinner [id number]. In review of her meals I find that she is having greater portions of carbohydrates (tortilla, rice, beans, lentils) than is recommended. I have encouraged her to have half portions of what she is now eating. Increase protein and vegetables. She is eating Activia Yogurt. I suggested she change to [employer], or Yoplait [employer] [place]. This has less carbs and greater protein. She has an appointment with MD this Wednesday. I have encouraged her to modify dietary intake and test/log glucose regularly. The doctor will evaluate her glucose readings further on Wednesday. Okey Regalarol assisted as Equities tradernterpreter.

## 2015-02-28 NOTE — Progress Notes (Signed)
Nutrition note: GDM diet review Pt was obese prior to pregnancy & has GDM. Pt has not seen a RD during this pregnancy yet. Pt has gained 12.4# @ 30w, which is wnl. Pt reports eating 3 meals & 3 snacks/d. Pt is taking a PNV. Pt reports walking ~1hr most days. Pt reports no N&V or heartburn. NKFA. Pt received verbal & written education in Spanish via an interpreter about GDM diet. Reviewed portion sizes & how many CHO portions she can have at meals & snacks. Provided pt with measuring cups to help measure her food, esp rice & beans. Discussed wt gain goals of 11-20# or 0.5#/wk. Pt agrees to follow GDM diet with 3 meals & 3 snacks/d with proper CHO/ protein combination. Pt has WIC & plans to BF. F/u in 2-4 wks Blondell RevealLaura Gehrig Patras, MS, RD, LDN, [facility]

## 2015-03-02 ENCOUNTER — Ambulatory Visit (INDEPENDENT_AMBULATORY_CARE_PROVIDER_SITE_OTHER): Payer: Self-pay | Admitting: Obstetrics and Gynecology

## 2015-03-02 VITALS — BP 125/77 | HR 76 | Wt 186.5 lb

## 2015-03-02 DIAGNOSIS — IMO0001 Reserved for inherently not codable concepts without codable children: Secondary | ICD-10-CM

## 2015-03-02 DIAGNOSIS — O24419 Gestational diabetes mellitus in pregnancy, unspecified control: Secondary | ICD-10-CM

## 2015-03-02 LAB — COMPREHENSIVE METABOLIC PANEL
ALK PHOS: 102 U/L (ref 39–117)
ALT: 11 U/L (ref 0–35)
AST: 15 U/L (ref 0–37)
Albumin: 3.3 g/dL — ABNORMAL LOW (ref 3.5–5.2)
BUN: 13 mg/dL (ref 6–23)
CHLORIDE: 104 meq/L (ref 96–112)
CO2: 22 meq/L (ref 19–32)
CREATININE: 0.46 mg/dL — AB (ref 0.50–1.10)
Calcium: 8.7 mg/dL (ref 8.4–10.5)
Glucose, Bld: 56 mg/dL — ABNORMAL LOW (ref 70–99)
Potassium: 4 mEq/L (ref 3.5–5.3)
SODIUM: 135 meq/L (ref 135–145)
TOTAL PROTEIN: 6.4 g/dL (ref 6.0–8.3)
Total Bilirubin: 0.3 mg/dL (ref 0.2–1.2)

## 2015-03-02 NOTE — Progress Notes (Signed)
Interpreter here.  A2/B DM. Taking Glyburide as directed. Fastings 63-80's except 93 x1. Postprandials not checked tid but all <124. Discussed checking after alternate meals. Reviewed diet and normal US result. Needs F/U US monthly and start fetal testing at 32 wks. No baseline lab results found> will reorder. Hx PTD x2.  Reports 4-5 contractions/day, status quo. No VB or spotting. Pelvic pressure x weeks especially with walking.  Had colpo - CIN1 >F/U Pap 1 yr.  Information on [facility].

## 2015-03-02 NOTE — Addendum Note (Signed)
Addended by: Gerome ApleyZEYFANG, Ascencion Stegner L on: 03/02/2015 08:52 AM   Modules accepted: Orders

## 2015-03-02 NOTE — Patient Instructions (Signed)
Eleccin del mtodo anticonceptivo (Contraception Choices) La anticoncepcin (control de la natalidad) es el uso de cualquier mtodo o dispositivo para evitar el embarazo. A continuacin se indican algunos de esos mtodos. MTODOS HORMONALES   El Implante contraconceptivo consiste en un tubo plstico delgado que contiene la hormona progesterona. No contiene estrgenos. El mdico inserta el tubo en la parte interna del brazo. El tubo puede permanecer en el lugar durante 3 aos. Despus de los 3 aos debe retirarse. El implante impide que los ovarios liberen vulos (ovulacin), espesa el moco cervical, lo que evita que los espermatozoides ingresen al tero y hace ms delgada la membrana que cubre el interior del tero.  Inyecciones de progesterona sola: las administra el mdico cada 3 meses para evitar el embarazo. La progesterona sinttica impide que los ovarios liberen vulos. Tambin hacen que el moco cervical se espese y modifique el tejido de recubrimiento interno del tero. Esto hace ms difcil que los espermatozoides sobrevivan en el tero.  Las pldoras anticonceptivas contienen estrgenos y progesterona. Su funcin es evitar que los ovarios liberen vulos (ovulacin). Las hormonas de los anticonceptivos orales hacen que el moco cervical se haga ms espeso, lo que evita que el esperma ingrese al tero. Las pldoras anticonceptivas son recetadas por el mdico.Tambin se utilizan para tratar los perodos menstruales abundantes.  Minipldora: este tipo de pldora anticonceptiva contiene slo hormona progesterona. Deben tomarse todos los das del mes y debe recetarlas el mdico.  El parche de control de natalidad: contiene hormonas similares a las que contienen las pldoras anticonceptivas. Deben cambiarse una vez por semana y se utilizan bajo prescripcin mdica.  Anillo vaginal: contiene hormonas similares a las que contienen las pldoras anticonceptivas. Se deja colocado durante tres semanas,  se lo retira durante 1 semana y luego se coloca uno nuevo. La paciente debe sentirse cmoda al insertar y retirar el anillo de la vagina.Es necesaria la prescripcin mdica.  Anticonceptivos de emergencia: son mtodos para evitar un embarazo despus de una relacin sexual sin proteccin. Esta pldora puede tomarse inmediatamente despus de tener relaciones sexuales o hasta 5 das de haber tenido sexo sin proteccin. Es ms efectiva si se toma poco tiempo despus de la relacin sexual. Los anticonceptivos de emergencia estn disponibles sin prescripcin mdica. Consltelo con su farmacutico. No use los anticonceptivos de emergencia como nico mtodo anticonceptivo. MTODOS DE BARRERA   Condn masculino: es una vaina delgada (ltex o goma) que se coloca cubriendo al pene durante el acto sexual. Puede usarse con espermicida para aumentar la efectividad.  Condn femenino. Es una funda delicada y blanda que se adapta holgadamente a la vagina antes de las relaciones sexuales.  Diafragma: es una barrera de ltex redonda y suave que debe ser recomendado por un profesional. Se inserta en la vagina, junto con un gel espermicida. Debe insertarse antes de tener relaciones sexuales. Debe dejar el diafragma colocado en la vagina durante 6 a 8 horas despus de la relacin sexual.  Capuchn cervical: es una barrera de ltex o taza plstica redonda y suave que cubre el cuello del tero y debe ser colocada por un mdico. Puede dejarlo colocado en la vagina hasta 48 horas despus de las relaciones sexuales.  Esponja: es una pieza blanda y circular de espuma de poliuretano. Contiene un espermicida. Se inserta en la vagina despus de mojarla y antes de las relaciones sexuales.  Espermicidas: son sustancias qumicas que matan o bloquean al esperma y no lo dejan ingresar al cuello del tero y al tero. Vienen   en forma de cremas, geles, supositorios, espuma o comprimidos. No es necesario tener receta mdica. Se insertan en  la vagina con un aplicador antes de tener relaciones sexuales. El proceso debe repetirse cada vez que tiene relaciones sexuales. ANTICONCEPTIVOS INTRAUTERINOS  Dispositivo intrauterino (DIU) es un dispositivo en forma de T que se coloca en el tero durante el perodo menstrual, para evitar el embarazo. Hay dos tipos:  DIU de cobre: este tipo de DIU est recubierto con un alambre de cobre y se inserta dentro del tero. El cobre hace que el tero y las trompas de Falopio produzcan un liquido que destruye los espermatozoides. Puede permanecer colocado durante 10 aos.  DIU con hormona: este tipo de DIU contiene la hormona progestina (progesterona sinttica). La hormona espesa el moco cervical y evita que los espermatozoides ingresen al tero y tambin afina la membrana que cubre el tero para evitar la implantacin del vulo fertilizado. La hormona debilita o destruye los espermatozoides que ingresan al tero. Puede permanecer en el lugar durante 3-5 aos, segn el tipo de DIU que se utilice. MTODOS ANTICONCEPTIVOS PERMANENTES  Ligadura de trompas en la mujer: se realiza sellando, atando u obstruyendo quirrgicamente las trompas de Falopio lo que impide que el vulo descienda hacia el tero.  Esterilizacin histeroscpica: Implica la colocacin de un pequeo espiral o la insercin en cada trompa de Falopio. El mdico utiliza una tcnica llamada histeroscopa para realizar este procedimiento. El dispositivo produce la formacin de tejido cicatrizal. Esto da como resultado una obstruccin permanente de las trompas de Falopio, de modo que la esperma no pueda fertilizar el vulo. Demora alrededor de 3 meses despus del procedimiento hasta que el conducto se obstruye. Tendr que usar otro mtodo anticonceptivo durante al menos 3 meses.  Esterilizacin masculina: se realiza ligando los conductos por los que pasan los espermatozoides (vasectoma).Esto impide que el esperma ingrese a la vagina durante el acto  sexual. Luego del procedimiento, el hombre puede eyacular lquido (semen). MTODOS DE PLANIFICACIN NATURAL  Planificacin familiar natural: consiste en no tener relaciones sexuales o usar un mtodo de barrera (condn, diafragma, capuchn cervical) en los das que la mujer podra quedar embarazada.  Mtodo de calendario: consiste en el seguimiento de la duracin de cada ciclo menstrual y la identificacin de los perodos frtiles.  Mtodo de ovulacin: consiste en evitar las relaciones sexuales durante la ovulacin.  Mtodo sintotrmico: consiste en evitar las relaciones sexuales en la poca en la que se est ovulando, utilizando un termmetro y tendiendo en cuenta los sntomas de la ovulacin.  Mtodo postovulacin: consiste en planificar las relaciones sexuales para despus de haber ovulado. Independientemente del tipo o mtodo anticonceptivo que usted elija, es importante que use condones para protegerse contra las infecciones de transmisin sexual (ETS). Hable con su mdico con respecto a qu mtodo anticonceptivo es el ms apropiado para usted. Document Released: 11/19/2005 Document Revised: 07/22/2013 ExitCare Patient Information 2015 [employer]. This information is not intended to replace advice given to you by your health care provider. Make sure you discuss any questions you have with your health care provider.  

## 2015-03-02 NOTE — Progress Notes (Signed)
Sara Schwartz - interpreter present for encounter today.  [facility]30[facility]

## 2015-03-07 ENCOUNTER — Ambulatory Visit (INDEPENDENT_AMBULATORY_CARE_PROVIDER_SITE_OTHER): Payer: Self-pay | Admitting: Family Medicine

## 2015-03-07 ENCOUNTER — Telehealth: Payer: Self-pay

## 2015-03-07 VITALS — BP 112/63 | HR 73 | Temp 98.5°F | Wt 189.3 lb

## 2015-03-07 DIAGNOSIS — Z2839 Other underimmunization status: Secondary | ICD-10-CM

## 2015-03-07 DIAGNOSIS — O9989 Other specified diseases and conditions complicating pregnancy, childbirth and the puerperium: Secondary | ICD-10-CM

## 2015-03-07 DIAGNOSIS — O09213 Supervision of pregnancy with history of pre-term labor, third trimester: Secondary | ICD-10-CM

## 2015-03-07 DIAGNOSIS — O24419 Gestational diabetes mellitus in pregnancy, unspecified control: Secondary | ICD-10-CM

## 2015-03-07 DIAGNOSIS — IMO0001 Reserved for inherently not codable concepts without codable children: Secondary | ICD-10-CM

## 2015-03-07 DIAGNOSIS — O09893 Supervision of other high risk pregnancies, third trimester: Secondary | ICD-10-CM

## 2015-03-07 DIAGNOSIS — Z283 Underimmunization status: Secondary | ICD-10-CM

## 2015-03-07 DIAGNOSIS — O09523 Supervision of elderly multigravida, third trimester: Secondary | ICD-10-CM

## 2015-03-07 NOTE — Patient Instructions (Signed)
Diabetes mellitus gestacional (Gestational Diabetes Mellitus) La diabetes mellitus gestacional, ms comnmente conocida como diabetes gestacional es un tipo de diabetes que desarrollan algunas mujeres durante el embarazo. En la diabetes gestacional, el pncreas no produce suficiente insulina (una hormona) o las clulas son menos sensibles a la insulina producida (resistencia a la insulina), o ambas cosas. Normalmente, la insulina mueve los azcares de los alimentos a las clulas de los tejidos. Las clulas de los tejidos utilizan los azcares para obtener energa. La falta de insulina o la falta de una respuesta normal a la insulina hace que el exceso de azcar se acumule en la sangre en lugar de penetrar en las clulas de los tejidos. Como resultado, se producen niveles altos de azcar en la sangre (hiperglucemia). El efecto de los niveles altos de azcar (glucosa) puede causar muchos problemas.  FACTORES DE RIESGO Usted tiene mayor probabilidad de desarrollar diabetes gestacional si tiene antecedentes familiares de diabetes y tambin si tiene uno o ms de los siguientes factores de riesgo:  ndice de masa corporal superior a 30 (obesidad).  Embarazo previo con diabetes gestacional.  La edad avanzada en el momento del embarazo. Si se mantienen los niveles de glucosa en la sangre en un rango normal durante el embarazo, las mujeres pueden tener un embarazo saludable. Si los niveles de glucosa en la sangre no estn bien controlados, puede haber riesgos para usted, el feto o el recin nacido, o durante el trabajo de parto y el parto.  SNTOMAS  Si se presentan sntomas, stos son similares a los sntomas que normalmente experimentar durante el embarazo. Los sntomas de la diabetes gestacional son:   Aumento de la sed (polidipsia).  Aumento de la miccin (poliuria).  Orina con ms frecuencia durante la noche (nocturia).  Prdida de peso. La prdida de peso puede ser muy rpida.  Infecciones  frecuentes y recurrentes.  Cansancio (fatiga).  Debilidad.  Cambios en la visin, como visin borrosa.  Olor a fruta en el aliento.  Dolor abdominal. DIAGNSTICO La diabetes se diagnostica cuando hay aumento de los niveles de glucosa en la sangre. El nivel de glucosa en la sangre puede controlarse en uno o ms de los siguientes anlisis de sangre:  Medicin de glucosa en la sangre en ayunas. No se le permitir comer durante al menos 8 horas antes de que se tome una muestra de sangre.  Pruebas al azar de glucosa en la sangre. El nivel de glucosa en la sangre se controla en cualquier momento del da sin importar el momento en que haya comido.  Prueba de A1c (hemoglobina glucosilada) Una prueba de A1c proporciona informacin sobre el control de la glucosa en la sangre durante los ltimos 3 meses.  Prueba de tolerancia a la glucosa oral (PTGO). La glucosa en la sangre se mide despus de no haber comido (ayunas) durante una a tres horas y despus de beber una bebida que contenga glucosa. Dado que las hormonas que causan la resistencia a la insulina son ms altas alrededor de las semanas 24 a 28 de embarazo, generalmente se realiza una PTGO durante ese tiempo. Si tiene factores de riesgo de diabetes gestacional, su mdico puede hacerle estudios de deteccin antes de las 24semanas de embarazo. TRATAMIENTO   Usted tendr que tomar medicamentos para la diabetes o insulina diariamente para mantener los niveles de glucosa en la sangre en el rango deseado.  Usted tendr que combinar la dosis de insulina con la actividad fsica y la eleccin de alimentos saludables. El objetivo del   tratamiento es mantener el nivel de azcar en la sangre previo a comer (preprandial) y durante la noche entre 60 y 99mg/dl, durante todo el embarazo. El objetivo del tratamiento es mantener el nivel pico de azcar en la sangre despus de comer (glucosa posprandial) entre 100y 140mg/dl. INSTRUCCIONES PARA EL CUIDADO EN EL  HOGAR   Controle su nivel de hemoglobina A1c dos veces al ao.  Contrlese a diario el nivel de glucosa en la sangre segn las indicaciones de su mdico. Es comn realizar controles frecuentes de la glucosa en la sangre.  Supervise las cetonas en la orina cuando est enferma y segn las indicaciones de su mdico.  Tome el medicamento para la diabetes y adminstrese insulina segn las indicaciones de su mdico para mantener el nivel de glucosa en la sangre en el rango deseado.  Nunca se quede sin medicamento para la diabetes o sin insulina. Es necesario que la reciba todos los das.  Ajuste la insulina segn la ingesta de hidratos de carbono. Los hidratos de carbono pueden aumentar los niveles de glucosa en la sangre, pero deben incluirse en su dieta. Los hidratos de carbono aportan vitaminas, minerales y fibra que son una parte esencial de una dieta saludable. Los hidratos de carbono se encuentran en frutas, verduras, cereales integrales, productos lcteos, legumbres y alimentos que contienen azcares aadidos.  Consuma alimentos saludables. Alterne 3 comidas con 3 colaciones.  Aumente de peso saludablemente. El aumento del peso total vara de acuerdo con el ndice de masa corporal que tena antes del embarazo ([facility]).  Lleve una tarjeta de alerta mdica o use una pulsera o medalla de alerta mdica.  Lleve con usted una colacin de 15gramos de hidratos de carbono en todo momento para controlar los niveles bajos de glucosa en la sangre (hipoglucemia). Algunos ejemplos de colaciones de 15gramos de hidratos de carbono son los siguientes:  Tabletas de glucosa, 3 o 4.  Gel de glucosa, tubo de 15 gramos.  Pasas de uva, 2 cucharadas (24 g).  Caramelos de goma, 6.  Galletas de animales, 8.  Jugo de fruta, gaseosa comn, o leche descremada, 4 onzas (120 ml).  Pastillas de goma, 9.  Reconocer la hipoglucemia. Durante el embarazo la hipoglucemia se produce cuando hay niveles de glucosa en la  sangre de 60 mg/dl o menos. El riesgo de hipoglucemia aumenta durante el ayuno o cuando se saltea las comidas, durante o despus de realizar ejercicio intenso y mientras duerme. Los sntomas de hipoglucemia son:  Temblores o sacudidas.  Disminucin de la capacidad de concentracin.  Sudoracin.  Aumento de la frecuencia cardaca.  Dolor de cabeza.  Sequedad en la boca.  Hambre.  Irritabilidad.  Ansiedad.  Sueo agitado.  Alteracin del habla o de la coordinacin.  Confusin.  Tratar la hipoglucemia rpidamente. Si usted est alerta y puede tragar con seguridad, siga la regla de 15/15 que consiste en:  Tome entre 15 y 20gramos de glucosa de accin rpida o carbohidratos. Las opciones de accin rpida son un gel de glucosa, tabletas de glucosa, o 4 onzas (120 ml) de jugo de frutas, gaseosa comn, o leche baja en grasa.  Compruebe su nivel de glucosa en la sangre 15 minutos despus de tomar la glucosa.  Tome entre 15 y 20 gramos ms de glucosa si el nivel de glucosa en la sangre todava es de 70mg/dl o inferior.  Ingiera una comida o una colacin en el lapso de 1 hora una vez que los niveles de glucosa en la sangre vuelven   a la normalidad.  Est atento a la poliuria (miccin excesiva) y la polidipsia (sensacin de mucha sed), que son los primeros signos de la hiperglucemia. El reconocimiento temprano de la hiperglucemia permite un tratamiento oportuno. Trate la hiperglucemia segn le indic su mdico.  Haga actividad fsica por lo menos 30minutos al da o como lo indique su mdico. Se recomienda que 30 minutos despus de cada comida, realice diez minutos de actividad fsica para controlar los niveles de glucosa postprandial en la sangre.  Ajuste su dosis de insulina y la ingesta de alimentos, segn sea necesario, si inicia un nuevo ejercicio o deporte.  Siga su plan para los das de enfermedad cuando no pueda comer o beber como de costumbre.  Evite el tabaco y el  alcohol.  Concurra a todas las visitas de control como se lo haya indicado el mdico.  Siga el consejo del mdico respecto a los controles prenatales y posteriores al parto (postparto), las visitas, la planificacin de las comidas, el ejercicio, los medicamentos, las vitaminas, los anlisis de sangre, otras pruebas mdicas y actividades fsicas.  Realice diariamente el cuidado de la piel y de los pies. Examine su piel y los pies diariamente para ver si tiene cortes, moretones, enrojecimiento, problemas en las uas, sangrado, ampollas o llagas.  Cepllese los dientes y encas por lo menos dos veces al da y use hilo dental al menos una vez por da. Concurra regularmente a las visitas de control con el dentista.  Programe un examen de vista durante el primer trimestre de su embarazo o como lo indique su mdico.  Comparta su plan de control de diabetes en el trabajo o en la escuela.  Mantngase al da con las vacunas.  Aprenda a manejar el estrs.  Obtenga la mayor cantidad posible de informacin sobre la diabetes y solicite ayuda siempre que sea necesario.  Obtenga informacin sobre el amamantamiento y analice esta posibilidad.  Debe controlar el nivel de azcar en la sangre de 6a 12semanas despus del parto. Esto se hace con una prueba de tolerancia a la glucosa oral (PTGO). SOLICITE ATENCIN MDICA SI:   No puede comer alimentos o beber por ms de 6 horas.  Tuvo nuseas o ha vomitado durante ms de 6 horas.  Tiene un nivel de glucosa en la sangre de 200 mg/dl y cetonas en la orina.  Presenta algn cambio en el estado mental.  Desarrolla problemas de visin.  Sufre un dolor persistente de cabeza.  Siente dolor o molestias en la parte superior del abdomen.  Desarrolla una enfermedad grave adicional.  Tuvo diarrea durante ms de 6 horas.  Ha estado enfermo o ha tenido fiebre durante un par de das y no mejora. SOLICITE ATENCIN MDICA DE INMEDIATO SI:   Tiene dificultad  para respirar.  Ya no siente los movimientos del beb.  Est sangrando o tiene flujo vaginal.  Comienza a tener contracciones o trabajo de parto prematuro. ASEGRESE DE QUE:  Comprende estas instrucciones.  Controlar su afeccin.  Recibir ayuda de inmediato si no mejora o si empeora. Document Released: 08/29/2005 Document Revised: 04/05/2014 [employer] Patient Information 2015 [employer]. This information is not intended to replace advice given to you by your health care provider. Make sure you discuss any questions you have with your health care provider.  Lactancia materna (Breastfeeding) Decidir amamantar es una de las mejores elecciones que puede hacer por usted y su beb. El cambio hormonal durante el embarazo produce el desarrollo del tejido mamario y aumenta la cantidad   y el tamao de los conductos galactforos. Estas hormonas tambin permiten que las protenas, los azcares y las grasas de la sangre produzcan la leche materna en las glndulas productoras de leche. Las hormonas impiden que la leche materna sea liberada antes del nacimiento del beb, adems de impulsar el flujo de leche luego del nacimiento. Una vez que ha comenzado a amamantar, pensar en el beb, as como la succin o el llanto, pueden estimular la liberacin de leche de las glndulas productoras de leche.  LOS BENEFICIOS DE AMAMANTAR Para el beb  La primera leche (calostro) ayuda a mejorar el funcionamiento del sistema digestivo del beb.  La leche tiene anticuerpos que ayudan a prevenir las infecciones en el beb.  El beb tiene una menor incidencia de asma, alergias y del sndrome de muerte sbita del lactante.  Los nutrientes en la leche materna son mejores para el beb que la leche maternizada y estn preparados exclusivamente para cubrir las necesidades del beb.  La leche materna mejora el desarrollo cerebral del beb.  Es menos probable que el beb desarrolle otras enfermedades, como obesidad  infantil, asma o diabetes mellitus de tipo 2. Para usted   La lactancia materna favorece el desarrollo de un vnculo muy especial entre la madre y el beb.  Es conveniente. La leche materna siempre est disponible a la temperatura correcta y es econmica.  La lactancia materna ayuda a quemar caloras y a perder el peso ganado durante el embarazo.  Favorece la contraccin del tero al tamao que tena antes del embarazo de manera ms rpida y disminuye el sangrado (loquios) despus del parto.  La lactancia materna contribuye a reducir el riesgo de desarrollar diabetes mellitus de tipo 2, osteoporosis o cncer de mama o de ovario en el futuro. SIGNOS DE QUE EL BEB EST HAMBRIENTO Primeros signos de hambre  Aumenta su estado de alerta o actividad.  Se estira.  Mueve la cabeza de un lado a otro.  Mueve la cabeza y abre la boca cuando se le toca la mejilla o la comisura de la boca (reflejo de bsqueda).  Aumenta las vocalizaciones, tales como sonidos de succin, se relame los labios, emite arrullos, suspiros, o chirridos.  Mueve la mano hacia la boca.  Se chupa con ganas los dedos o las manos. Signos tardos de hambre  Est agitado.  Llora de manera intermitente. Signos de hambre extrema Los signos de hambre extrema requerirn que lo calme y lo consuele antes de que el beb pueda alimentarse adecuadamente. No espere a que se manifiesten los siguientes signos de hambre extrema para comenzar a amamantar:   Agitacin.  Llanto intenso y fuerte.   Gritos. INFORMACIN BSICA SOBRE LA LACTANCIA MATERNA Iniciacin de la lactancia materna  Encuentre un lugar cmodo para sentarse o acostarse, con un buen respaldo para el cuello y la espalda.  Coloque una almohada o una manta enrollada debajo del beb para acomodarlo a la altura de la mama (si est sentada). Las almohadas para amamantar se han diseado especialmente a fin de servir de apoyo para los brazos y el beb mientras  amamanta.  Asegrese de que el abdomen del beb est frente al suyo.  Masajee suavemente la mama. Con las yemas de los dedos, masajee la pared del pecho hacia el pezn en un movimiento circular. Esto estimula el flujo de leche. Es posible que deba continuar este movimiento mientras amamanta si la leche fluye lentamente.  Sostenga la mama con el pulgar por arriba del pezn y los otros   4 dedos por debajo de la mama. Asegrese de que los dedos se encuentren lejos del pezn y de la boca del beb.  Empuje suavemente los labios del beb con el pezn o con el dedo.  Cuando la boca del beb se abra lo suficiente, acrquelo rpidamente a la mama e introduzca todo el pezn y la zona oscura que lo rodea (areola), tanto como sea posible, dentro de la boca del beb.  Debe haber ms areola visible por arriba del labio superior del beb que por debajo del labio inferior.  La lengua del beb debe estar entre la enca inferior y la mama.  Asegrese de que la boca del beb est en la posicin correcta alrededor del pezn (prendida). Los labios del beb deben crear un sello sobre la mama y estar doblados hacia afuera (invertidos).  Es comn que el beb succione durante 2 a 3 minutos para que comience el flujo de leche materna. Cmo debe prenderse Es muy importante que le ensee al beb cmo prenderse adecuadamente a la mama. Si el beb no se prende adecuadamente, puede causarle dolor en el pezn y reducir la produccin de leche materna, y hacer que el beb tenga un escaso aumento de peso. Adems, si el beb no se prende adecuadamente al pezn, puede tragar aire durante la alimentacin. Esto puede causarle molestias al beb. Hacer eructar al beb al cambiar de mama puede ayudarlo a liberar el aire. Sin embargo, ensearle al beb cmo prenderse a la mama adecuadamente es la mejor manera de evitar que se sienta molesto por tragar aire mientras se alimenta. Signos de que el beb se ha prendido adecuadamente al pezn:    Tironea o succiona de modo silencioso, sin causarle dolor.  Se escucha que traga cada 3 o 4 succiones.   Hay movimientos musculares por arriba y por delante de sus odos al succionar. Signos de que el beb no se ha prendido adecuadamente al pezn:   Hace ruidos de succin o de chasquido mientras se alimenta.  Siente dolor en el pezn. Si cree que el beb no se prendi correctamente, deslice el dedo en la comisura de la boca y colquelo entre las encas del beb para interrumpir la succin. Intente comenzar a amamantar nuevamente. Signos de lactancia materna exitosa Signos del beb:   Disminuye gradualmente el nmero de succiones o cesa la succin por completo.  Se duerme.  Relaja el cuerpo.  Retiene una pequea cantidad de leche en la boca.  Se desprende solo del pecho. Signos que presenta usted:  Las mamas han aumentado la firmeza, el peso y el tamao 1 a 3 horas despus de amamantar.  Estn ms blandas inmediatamente despus de amamantar.  Un aumento del volumen de leche, y tambin un cambio en su consistencia y color se producen hacia el quinto da de lactancia materna.  Los pezones no duelen, ni estn agrietados ni sangran. Signos de que su beb recibe la cantidad de leche suficiente  Moja al menos 3 paales en 24 horas. La orina debe ser clara y de color amarillo plido a los 5 das de vida.  Defeca al menos 3 veces en 24 horas a los 5 das de vida. La materia fecal debe ser blanda y amarillenta.  Defeca al menos 3 veces en 24 horas a los 7 das de vida. La materia fecal debe ser grumosa y amarillenta.  No registra una prdida de peso mayor del 10% del peso al nacer durante los primeros 3 das de vida.    Aumenta de peso un promedio de 4 a 7onzas (113 a 198g) por semana despus de los 4 das de vida.  Aumenta de peso, diariamente, de manera uniforme a partir de los 5 das de vida, sin registrar prdida de peso despus de las 2semanas de vida. Despus de  alimentarse, es posible que el beb regurgite una pequea cantidad. Esto es frecuente. FRECUENCIA Y DURACIN DE LA LACTANCIA MATERNA El amamantamiento frecuente la ayudar a producir ms leche y a prevenir problemas de dolor en los pezones e hinchazn en las mamas. Alimente al beb cuando muestre signos de hambre o si siente la necesidad de reducir la congestin de las mamas. Esto se denomina "lactancia a demanda". Evite el uso del chupete mientras trabaja para establecer la lactancia (las primeras 4 a 6 semanas despus del nacimiento del beb). Despus de este perodo, podr ofrecerle un chupete. Las investigaciones demostraron que el uso del chupete durante el primer ao de vida del beb disminuye el riesgo de desarrollar el sndrome de muerte sbita del lactante (SMSL). Permita que el nio se alimente en cada mama todo lo que desee. Contine amamantando al beb hasta que haya terminado de alimentarse. Cuando el beb se desprende o se queda dormido mientras se est alimentando de la primera mama, ofrzcale la segunda. Debido a que, con frecuencia, los recin nacidos permanecen somnolientos las primeras semanas de vida, es posible que deba despertar al beb para alimentarlo. Los horarios de lactancia varan de un beb a otro. Sin embargo, las siguientes reglas pueden servir como gua para ayudarla a garantizar que el beb se alimenta adecuadamente:  Se puede amamantar a los recin nacidos (bebs de 4 semanas o menos de vida) cada 1 a 3 horas.  No deben transcurrir ms de 3 horas durante el da o 5 horas durante la noche sin que se amamante a los recin nacidos.  Debe amamantar al beb 8 veces como mnimo en un perodo de 24 horas, hasta que comience a introducir slidos en su dieta, a los 6 meses de vida aproximadamente. EXTRACCIN DE LECHE MATERNA La extraccin y el almacenamiento de la leche materna le permiten asegurarse de que el beb se alimente exclusivamente de leche materna, aun en momentos en  los que no puede amamantar. Esto tiene especial importancia si debe regresar al trabajo en el perodo en que an est amamantando o si no puede estar presente en los momentos en que el beb debe alimentarse. Su asesor en lactancia puede orientarla sobre cunto tiempo es seguro almacenar leche materna.  El sacaleche es un aparato que le permite extraer leche de la mama a un recipiente estril. Luego, la leche materna extrada puede almacenarse en un refrigerador o congelador. Algunos sacaleches son manuales, mientras que otros son elctricos. Consulte a su asesor en lactancia qu tipo ser ms conveniente para usted. Los sacaleches se pueden comprar; sin embargo, algunos hospitales y grupos de apoyo a la lactancia materna alquilan sacaleches mensualmente. Un asesor en lactancia puede ensearle cmo extraer leche materna manualmente, en caso de que prefiera no usar un sacaleche.  CMO CUIDAR LAS MAMAS DURANTE LA LACTANCIA MATERNA Los pezones se secan, agrietan y duelen durante la lactancia materna. Las siguientes recomendaciones pueden ayudarla a mantener las mamas humectadas y sanas:  Evite usar jabn en los pezones.  Use un sostn de soporte. Aunque no son esenciales, las camisetas sin mangas o los sostenes especiales para amamantar estn diseados para acceder fcilmente a las mamas, para amamantar sin tener que quitarse todo   el sostn o la camiseta. Evite usar sostenes con aro o sostenes muy ajustados.  Seque al aire sus pezones durante 3 a 4minutos despus de amamantar al beb.  Utilice solo apsitos de algodn en el sostn para absorber las prdidas de leche. La prdida de un poco de leche materna entre las tomas es normal.  Utilice lanolina sobre los pezones luego de amamantar. La lanolina ayuda a mantener la humedad normal de la piel. Si usa lanolina pura, no tiene que lavarse los pezones antes de volver a alimentar al beb. La lanolina pura no es txica para el beb. Adems, puede extraer  manualmente algunas gotas de leche materna y masajear suavemente esa leche sobre los pezones, para que la leche se seque al aire. Durante las primeras semanas despus de dar a luz, algunas mujeres pueden experimentar hinchazn en las mamas (congestin mamaria). La congestin puede hacer que sienta las mamas pesadas, calientes y sensibles al tacto. El pico de la congestin ocurre dentro de los 3 a 5 das despus del parto. Las siguientes recomendaciones pueden ayudarla a aliviar la congestin:  Vace por completo las mamas al amamantar o extraer leche. Puede aplicar calor hmedo en las mamas (en la ducha o con toallas hmedas para manos) antes de amamantar o extraer leche. Esto aumenta la circulacin y ayuda a que la leche fluya. Si el beb no vaca por completo las mamas cuando lo amamanta, extraiga la leche restante despus de que haya finalizado.  Use un sostn ajustado (para amamantar o comn) o una camiseta sin mangas durante 1 o 2 das para indicar al cuerpo que disminuya ligeramente la produccin de leche.  Aplique compresas de hielo sobre las mamas, a menos que le resulte demasiado incmodo.  Asegrese de que el beb est prendido y se encuentre en la posicin correcta mientras lo alimenta. Si la congestin persiste luego de 48 horas o despus de seguir estas recomendaciones, comunquese con su mdico o un asesor en lactancia. RECOMENDACIONES GENERALES PARA EL CUIDADO DE LA SALUD DURANTE LA LACTANCIA MATERNA  Consuma alimentos saludables. Alterne comidas y colaciones, y coma 3 de cada una por da. Dado que lo que come afecta la leche materna, es posible que algunas comidas hagan que su beb se vuelva ms irritable de lo habitual. Evite comer este tipo de alimentos si percibe que afectan de manera negativa al beb.  Beba leche, jugos de fruta y agua para satisfacer su sed (aproximadamente 10 vasos al da).  Descanse con frecuencia, reljese y tome sus vitaminas prenatales para evitar la  fatiga, el estrs y la anemia.  Contine con los autocontroles de la mama.  Evite masticar y fumar tabaco.  Evite el consumo de alcohol y drogas. Algunos medicamentos, que pueden ser perjudiciales para el beb, pueden pasar a travs de la leche materna. Es importante que consulte a su mdico antes de tomar cualquier medicamento, incluidos todos los medicamentos recetados y de venta libre, as como los suplementos vitamnicos y herbales. Puede quedar embarazada durante la lactancia. Si desea controlar la natalidad, consulte a su mdico cules son las opciones ms seguras para el beb. SOLICITE ATENCIN MDICA SI:   Usted siente que quiere dejar de amamantar o se siente frustrada con la lactancia.  Siente dolor en las mamas o en los pezones.  Sus pezones estn agrietados o sangran.  Sus pechos estn irritados, sensibles o calientes.  Tiene un rea hinchada en cualquiera de las mamas.  Siente escalofros o fiebre.  Tiene nuseas o vmitos.    Presenta una secrecin de otro lquido distinto de la leche materna de los pezones.  Sus mamas no se llenan antes de amamantar al beb para el quinto da despus del parto.  Se siente triste y deprimida.  El beb est demasiado somnoliento como para comer bien.  El beb tiene problemas para dormir.  Moja menos de 3 paales en 24 horas.  Defeca menos de 3 veces en 24 horas.  La piel del beb o la parte blanca de los ojos se vuelven amarillentas.  El beb no ha aumentado de peso a los 5 das de vida. SOLICITE ATENCIN MDICA DE INMEDIATO SI:   El beb est muy cansado (letargo) y no se quiere despertar para comer.  Le sube la fiebre sin causa. Document Released: 11/19/2005 Document Revised: 11/24/2013 ExitCare Patient Information 2015 [employer]. This information is not intended to replace advice given to you by your health care provider. Make sure you discuss any questions you have with your health care provider.  

## 2015-03-07 NOTE — Telephone Encounter (Signed)
-----   Message from Drucilla Schmidtiane L Day, RN sent at 03/03/2015  5:14 PM EDT ----- Regarding: obtain fetal echo We need to obtain Fetal Echo report from Dr. Casilda Carlsotton's office. It was performed on 02/03/15.

## 2015-03-07 NOTE — Telephone Encounter (Signed)
Contacted Dr. Casilda Carlsotton's office-- fetal echo report to be faxed to clinic this morning.

## 2015-03-07 NOTE — Progress Notes (Signed)
Carol used for interpreter 

## 2015-03-07 NOTE — Progress Notes (Signed)
Spanish interpreter: Blanca used FBS 69-89 2 hour pp 68-130 2 out of range

## 2015-03-08 LAB — CREATININE CLEARANCE, URINE, 24 HOUR
Creatinine Clearance: 242 mL/min — ABNORMAL HIGH (ref 75–115)
Creatinine, 24H Ur: 1741 mg/d (ref 700–1800)
Creatinine, Urine: 59 mg/dL
Creatinine: 0.5 mg/dL (ref 0.50–1.10)

## 2015-03-08 LAB — PROTEIN, URINE, 24 HOUR
PROTEIN 24H UR: 177 mg/d — AB (ref <150)
Protein, Urine: 6 mg/dL (ref 5–24)

## 2015-03-09 ENCOUNTER — Ambulatory Visit: Payer: Self-pay | Admitting: *Deleted

## 2015-03-09 VITALS — BP 116/72 | HR 73 | Temp 98.2°F

## 2015-03-09 DIAGNOSIS — N390 Urinary tract infection, site not specified: Secondary | ICD-10-CM

## 2015-03-09 DIAGNOSIS — O2343 Unspecified infection of urinary tract in pregnancy, third trimester: Principal | ICD-10-CM

## 2015-03-09 DIAGNOSIS — O09523 Supervision of elderly multigravida, third trimester: Secondary | ICD-10-CM

## 2015-03-09 LAB — POCT URINALYSIS DIP (DEVICE)
BILIRUBIN URINE: NEGATIVE
Glucose, UA: NEGATIVE mg/dL
Hgb urine dipstick: NEGATIVE
KETONES UR: NEGATIVE mg/dL
NITRITE: NEGATIVE
Protein, ur: NEGATIVE mg/dL
Specific Gravity, Urine: 1.025 (ref 1.005–1.030)
Urobilinogen, UA: 0.2 mg/dL (ref 0.0–1.0)
pH: 6 (ref 5.0–8.0)

## 2015-03-09 NOTE — Progress Notes (Signed)
Urinalysis shows large leukocytes and negative nitrites.

## 2015-03-09 NOTE — Progress Notes (Signed)
Per chart review, pt had Ob visit already this week on 4/4 and does not need to start fetal testing until next week (32 wks). Her appt was scheduled today in error. Pt was informed with interpreter Albertina SenegalMarly Adams that she does not require a visit until 4/11 as scheduled. She voiced understanding. Since random UA shows large leukocytes, specimen sent for culture.

## 2015-03-09 NOTE — Progress Notes (Signed)
Spanish Interpreter Leana RoeMarley Adams

## 2015-03-12 LAB — CULTURE, OB URINE: Colony Count: 30000

## 2015-03-14 ENCOUNTER — Ambulatory Visit (INDEPENDENT_AMBULATORY_CARE_PROVIDER_SITE_OTHER): Payer: Self-pay | Admitting: Obstetrics and Gynecology

## 2015-03-14 VITALS — BP 106/60 | HR 73 | Temp 98.3°F | Wt 188.2 lb

## 2015-03-14 DIAGNOSIS — IMO0001 Reserved for inherently not codable concepts without codable children: Secondary | ICD-10-CM

## 2015-03-14 DIAGNOSIS — O24419 Gestational diabetes mellitus in pregnancy, unspecified control: Secondary | ICD-10-CM

## 2015-03-14 DIAGNOSIS — O09523 Supervision of elderly multigravida, third trimester: Secondary | ICD-10-CM

## 2015-03-14 DIAGNOSIS — O09213 Supervision of pregnancy with history of pre-term labor, third trimester: Secondary | ICD-10-CM

## 2015-03-14 DIAGNOSIS — O09893 Supervision of other high risk pregnancies, third trimester: Secondary | ICD-10-CM

## 2015-03-14 LAB — POCT URINALYSIS DIP (DEVICE)
Bilirubin Urine: NEGATIVE
Glucose, UA: NEGATIVE mg/dL
Hgb urine dipstick: NEGATIVE
KETONES UR: NEGATIVE mg/dL
Nitrite: NEGATIVE
PROTEIN: NEGATIVE mg/dL
Specific Gravity, Urine: 1.015 (ref 1.005–1.030)
UROBILINOGEN UA: 0.2 mg/dL (ref 0.0–1.0)
pH: 6.5 (ref 5.0–8.0)

## 2015-03-14 NOTE — Progress Notes (Signed)
39 y.o. [id number] at 6636w0d here for routine OB visit. Doing well today.  1. A2GDM. Reviewed blood sugar log and mostly at goal. Continue glyburide 2.5 mg nightly. Continue diet/exercise. Start twice weekly testing today.  2. Routine PNC. Desires Paraguard IUD. FM/PTL precautions reviewed. RTC 1 week.

## 2015-03-14 NOTE — Addendum Note (Signed)
Addended by: Jill SideAY, Ahriyah Vannest L on: 03/14/2015 12:37 PM   Modules accepted: Level of Service

## 2015-03-14 NOTE — Progress Notes (Signed)
Elna Breslowarol hernandez present for interpretation.

## 2015-03-18 ENCOUNTER — Ambulatory Visit (INDEPENDENT_AMBULATORY_CARE_PROVIDER_SITE_OTHER): Payer: Self-pay | Admitting: *Deleted

## 2015-03-18 VITALS — BP 100/54 | HR 65

## 2015-03-18 DIAGNOSIS — IMO0001 Reserved for inherently not codable concepts without codable children: Secondary | ICD-10-CM

## 2015-03-18 DIAGNOSIS — O24419 Gestational diabetes mellitus in pregnancy, unspecified control: Secondary | ICD-10-CM

## 2015-03-18 NOTE — Progress Notes (Signed)
NST reactive.

## 2015-03-22 ENCOUNTER — Ambulatory Visit (INDEPENDENT_AMBULATORY_CARE_PROVIDER_SITE_OTHER): Payer: Self-pay | Admitting: Advanced Practice Midwife

## 2015-03-22 ENCOUNTER — Encounter: Payer: Self-pay | Admitting: Advanced Practice Midwife

## 2015-03-22 VITALS — BP 106/75 | HR 75 | Wt 188.7 lb

## 2015-03-22 DIAGNOSIS — IMO0001 Reserved for inherently not codable concepts without codable children: Secondary | ICD-10-CM

## 2015-03-22 DIAGNOSIS — O24419 Gestational diabetes mellitus in pregnancy, unspecified control: Secondary | ICD-10-CM

## 2015-03-22 LAB — POCT URINALYSIS DIP (DEVICE)
Bilirubin Urine: NEGATIVE
GLUCOSE, UA: NEGATIVE mg/dL
Hgb urine dipstick: NEGATIVE
Ketones, ur: NEGATIVE mg/dL
Nitrite: NEGATIVE
Protein, ur: NEGATIVE mg/dL
SPECIFIC GRAVITY, URINE: 1.015 (ref 1.005–1.030)
Urobilinogen, UA: 0.2 mg/dL (ref 0.0–1.0)
pH: 5.5 (ref 5.0–8.0)

## 2015-03-22 NOTE — Progress Notes (Signed)
Feels well. Rare contractions. Never got started on 17-P, now too late. States she did not think she could afford it. Did not know it would be paid for. Good blood sugar control. FBS=82,78,81,66,94,81,70 2 hr PP's= all under 120 except for 2 of 21 values (those two were 134 and 121).  PTL precautions.

## 2015-03-22 NOTE — Patient Instructions (Signed)
Informacin sobre el parto prematuro  (Preterm Labor Information) El parto prematuro comienza antes de la semana 37 de embarazo. La duracin de un embarazo normal es de 39 a 41 semanas.  CAUSAS  Generalmente no hay una causa que pueda identificarse del motivo por el que una mujer comienza un trabajo de parto prematuro. Sin embargo, una de las causas conocidas ms frecuentes son las infecciones. Las infecciones del tero, el cuello, la vagina, el lquido amnitico, la vejiga, los riones y hasta de los pulmones (neumona) pueden hacer que el trabajo de parto se inicie. Otras causas que pueden sospecharse son:   Infecciones urogenitales, como infecciones por hongos y vaginosis bacteriana.   Anormalidades uterinas (forma del tero, sptum uterino, fibromas, hemorragias en la placenta).   Un cuello que ha sido operado (puede ser que no permanezca cerrado).   Malformaciones del feto.   Gestaciones mltiples (mellizos, trillizos y ms).   Ruptura del saco amnitico.  FACTORES DE RIESGO   Historia previa de parto prematuro.   Tener ruptura prematura de las membranas (RPM).   La placenta cubre la abertura del cuello (placenta previa).   La placenta se separa del tero (abrupcin placentaria).   El cuello es demasiado dbil para contener al beb en el tero (cuello incompetente).   Hay mucho lquido en el saco amnitico (polihidramnios).   Consumo de drogas o hbito de fumar durante el embarazo.   No aumentar de peso lo suficiente durante el embarazo.   Mujeres menores de 18 aos o mayores de 35 aos.   Nivel socioeconmico bajo.   Pertenecer a la raza afroamericana. SNTOMAS  Los signos y sntomas del trabajo de parto prematuro son:   Clicos similares a los menstruales, dolor abdominal o dolor de espalda.  Contracciones uterinas regulares, tan frecuentes como seis por hora, sin importar su intensidad (pueden ser suaves o dolorosas).  Contracciones que comienzan  en la parte superior del tero y se expanden hacia abajo, a la zona inferior del abdomen y la espalda.   Sensacin de aumento de presin en la pelvis.   Aparece una secrecin acuosa o sanguinolenta por la vagina.  TRATAMIENTO  Segn el tiempo del embarazo y otras circunstancias, el mdico puede indicar reposo en cama. Si es necesario, le indicarn medicamentos para detener las contracciones y para madurar los pulmones del feto. Si el trabajo de parto se inicia antes de las 34 semanas de embarazo, se recomienda la hospitalizacin. El tratamiento depende de las condiciones en que se encuentren usted y el feto.  QU DEBE HACER SI PIENSA QUE EST EN TRABAJO DE PARTO PREMATURO?  Comunquese con su mdico inmediatamente. Debe concurrir al hospital para ser controlada inmediatamente.  CMO PUEDE EVITAR EL TRABAJO DE PARTO PREMATURO EN FUTUROS EMBARAZOS?  Usted debe:   Si fuma, abandonar el hbito.  Mantener un peso saludable y evitar sustancias qumicas y drogas innecesarias.  Controlar todo tipo de infeccin.  Informe a su mdico si tiene una historia conocida de trabajo de parto prematuro. Document Released: 02/26/2008 Document Revised: 07/22/2013 ExitCare Patient Information 2015 [employer]. This information is not intended to replace advice given to you by your health care provider. Make sure you discuss any questions you have with your health care provider.  

## 2015-03-22 NOTE — Progress Notes (Signed)
Interpreter Nile RiggsMariel Gallego present for encounter today.  US for growth scheduled 4/22.  Pt reports irregular UC's daily - mostly in the evening - less than 6/hr

## 2015-03-25 ENCOUNTER — Ambulatory Visit (HOSPITAL_COMMUNITY)
Admission: RE | Admit: 2015-03-25 | Discharge: 2015-03-25 | Disposition: A | Discharge status: Home / Self Care | Payer: Self-pay | Source: Ambulatory Visit | Attending: Advanced Practice Midwife | Admitting: Advanced Practice Midwife

## 2015-03-25 ENCOUNTER — Ambulatory Visit (INDEPENDENT_AMBULATORY_CARE_PROVIDER_SITE_OTHER): Payer: Self-pay | Admitting: *Deleted

## 2015-03-25 VITALS — BP 90/53 | HR 73

## 2015-03-25 DIAGNOSIS — O24419 Gestational diabetes mellitus in pregnancy, unspecified control: Secondary | ICD-10-CM | POA: Insufficient documentation

## 2015-03-25 DIAGNOSIS — IMO0001 Reserved for inherently not codable concepts without codable children: Secondary | ICD-10-CM

## 2015-03-25 NOTE — Progress Notes (Signed)
Interpreter Raquel Mora present for encounter.  

## 2015-03-25 NOTE — Progress Notes (Signed)
NST reactive.

## 2015-03-28 ENCOUNTER — Encounter: Payer: Self-pay | Admitting: Obstetrics and Gynecology

## 2015-03-28 ENCOUNTER — Ambulatory Visit (INDEPENDENT_AMBULATORY_CARE_PROVIDER_SITE_OTHER): Payer: Self-pay | Admitting: Obstetrics and Gynecology

## 2015-03-28 VITALS — BP 107/63 | HR 68 | Temp 98.2°F | Wt 188.2 lb

## 2015-03-28 DIAGNOSIS — O3442 Maternal care for other abnormalities of cervix, second trimester: Secondary | ICD-10-CM

## 2015-03-28 DIAGNOSIS — J45909 Unspecified asthma, uncomplicated: Secondary | ICD-10-CM

## 2015-03-28 DIAGNOSIS — O09523 Supervision of elderly multigravida, third trimester: Secondary | ICD-10-CM

## 2015-03-28 DIAGNOSIS — O24419 Gestational diabetes mellitus in pregnancy, unspecified control: Secondary | ICD-10-CM

## 2015-03-28 DIAGNOSIS — J4520 Mild intermittent asthma, uncomplicated: Secondary | ICD-10-CM

## 2015-03-28 DIAGNOSIS — IMO0001 Reserved for inherently not codable concepts without codable children: Secondary | ICD-10-CM

## 2015-03-28 DIAGNOSIS — O99519 Diseases of the respiratory system complicating pregnancy, unspecified trimester: Secondary | ICD-10-CM

## 2015-03-28 DIAGNOSIS — Z2839 Other underimmunization status: Secondary | ICD-10-CM

## 2015-03-28 DIAGNOSIS — O09893 Supervision of other high risk pregnancies, third trimester: Secondary | ICD-10-CM

## 2015-03-28 DIAGNOSIS — R87619 Unspecified abnormal cytological findings in specimens from cervix uteri: Secondary | ICD-10-CM

## 2015-03-28 DIAGNOSIS — O09213 Supervision of pregnancy with history of pre-term labor, third trimester: Secondary | ICD-10-CM

## 2015-03-28 DIAGNOSIS — O9989 Other specified diseases and conditions complicating pregnancy, childbirth and the puerperium: Secondary | ICD-10-CM

## 2015-03-28 DIAGNOSIS — Z283 Underimmunization status: Secondary | ICD-10-CM

## 2015-03-28 DIAGNOSIS — Z789 Other specified health status: Secondary | ICD-10-CM

## 2015-03-28 LAB — POCT URINALYSIS DIP (DEVICE)
BILIRUBIN URINE: NEGATIVE
GLUCOSE, UA: NEGATIVE mg/dL
KETONES UR: NEGATIVE mg/dL
Nitrite: NEGATIVE
PH: 5.5 (ref 5.0–8.0)
Protein, ur: NEGATIVE mg/dL
Specific Gravity, Urine: 1.02 (ref 1.005–1.030)
Urobilinogen, UA: 0.2 mg/dL (ref 0.0–1.0)

## 2015-03-28 NOTE — Progress Notes (Signed)
Large leuks and trace blood in urine.

## 2015-03-28 NOTE — Progress Notes (Signed)
Maria Elena used for interpreter 

## 2015-03-28 NOTE — Addendum Note (Signed)
Addended by: Jill SideAY, DIANE L on: 03/28/2015 11:16 AM   Modules accepted: Orders

## 2015-03-28 NOTE — Progress Notes (Signed)
Patient is doing well without complaints. FM/PTL precautions reviewed. CBGs reviewed and all within range. Continue glyburide NST today

## 2015-03-30 LAB — CULTURE, OB URINE: Colony Count: 40000

## 2015-04-01 ENCOUNTER — Ambulatory Visit (INDEPENDENT_AMBULATORY_CARE_PROVIDER_SITE_OTHER): Payer: Self-pay | Admitting: *Deleted

## 2015-04-01 VITALS — BP 92/61 | HR 73

## 2015-04-01 DIAGNOSIS — IMO0001 Reserved for inherently not codable concepts without codable children: Secondary | ICD-10-CM

## 2015-04-01 DIAGNOSIS — O24419 Gestational diabetes mellitus in pregnancy, unspecified control: Secondary | ICD-10-CM

## 2015-04-04 ENCOUNTER — Ambulatory Visit (INDEPENDENT_AMBULATORY_CARE_PROVIDER_SITE_OTHER): Payer: Self-pay | Admitting: Obstetrics & Gynecology

## 2015-04-04 VITALS — BP 113/71 | HR 68 | Temp 98.4°F | Wt 190.1 lb

## 2015-04-04 DIAGNOSIS — O24419 Gestational diabetes mellitus in pregnancy, unspecified control: Secondary | ICD-10-CM

## 2015-04-04 DIAGNOSIS — O09523 Supervision of elderly multigravida, third trimester: Secondary | ICD-10-CM

## 2015-04-04 DIAGNOSIS — N870 Mild cervical dysplasia: Secondary | ICD-10-CM

## 2015-04-04 DIAGNOSIS — IMO0001 Reserved for inherently not codable concepts without codable children: Secondary | ICD-10-CM

## 2015-04-04 LAB — POCT URINALYSIS DIP (DEVICE)
Bilirubin Urine: NEGATIVE
GLUCOSE, UA: NEGATIVE mg/dL
Ketones, ur: NEGATIVE mg/dL
NITRITE: NEGATIVE
PROTEIN: NEGATIVE mg/dL
Specific Gravity, Urine: 1.025 (ref 1.005–1.030)
Urobilinogen, UA: 0.2 mg/dL (ref 0.0–1.0)
pH: 6 (ref 5.0–8.0)

## 2015-04-04 MED ORDER — BECLOMETHASONE DIPROPIONATE 40 MCG/ACT IN AERS: 1.0000 | INHALATION_SPRAY | Freq: Two times a day (BID) | RESPIRATORY_TRACT | Status: DC

## 2015-04-04 NOTE — Progress Notes (Signed)
Hgb: Trace, Leukocytes: small 

## 2015-04-04 NOTE — Progress Notes (Signed)
Interpreter Blanca Lindner 

## 2015-04-04 NOTE — Progress Notes (Signed)
Nutrition note: GDM f/u  Pt has gained 14.1# @ 35w, which is wnl. Pt has been checking her BS- fasting: 79-94; 2hr pp: 81-129. Pt asked how many servings of milk could she have each day and whether cheese has CHO or not. Discussed recommendation of dairy (including milk) during pregnancy is 3 servings but discussed how milk has CHO so she needs to count it as one of her CHO servings with her meals or snacks. Discussed how cheese does not have CHO but discussed how during pregnancy she needs to be eating only cheese that has been pasturized.  Pt reports understanding & has no further ?s. F/u as needed Blondell RevealLaura Janele Lague, MS, RD, LDN, [facility]

## 2015-04-04 NOTE — Progress Notes (Signed)
NST reactive with baseline 150.  No decels. Fastings--78-92 with only one out of range; pp break--wnl; pp lunch WNL; pp dinner--123,95,107,129,114,115 Continue glyburide.  Induce at 39 weeks. Nml growth at 33 weeks--54%.  Next growth at 38 weeks. Pt using inhaler daily, up to 3 times a day.  Qvar added.  Called into [facility]--$45 vs [facility] $160 Pt has questions about diet and is meeting with nutritionist.

## 2015-04-04 NOTE — Progress Notes (Signed)
Nutrition note: GDM f/u Pt has gaiend

## 2015-04-06 ENCOUNTER — Encounter: Payer: Self-pay | Admitting: *Deleted

## 2015-04-07 ENCOUNTER — Ambulatory Visit (INDEPENDENT_AMBULATORY_CARE_PROVIDER_SITE_OTHER): Payer: Self-pay | Admitting: *Deleted

## 2015-04-07 VITALS — BP 104/61 | HR 70

## 2015-04-07 DIAGNOSIS — IMO0001 Reserved for inherently not codable concepts without codable children: Secondary | ICD-10-CM

## 2015-04-07 DIAGNOSIS — O24419 Gestational diabetes mellitus in pregnancy, unspecified control: Secondary | ICD-10-CM

## 2015-04-07 NOTE — Progress Notes (Signed)
NST reviewed and reactive.  Illyana Schorsch L. Harraway-Smith, M.D., FACOG    

## 2015-04-07 NOTE — Progress Notes (Signed)
NST reviewed and reactive.  Bryttani Blew L. Harraway-Smith, M.D., FACOG    

## 2015-04-11 ENCOUNTER — Ambulatory Visit (INDEPENDENT_AMBULATORY_CARE_PROVIDER_SITE_OTHER): Payer: Self-pay | Admitting: General Practice

## 2015-04-11 VITALS — BP 97/57 | HR 71 | Wt 188.5 lb

## 2015-04-11 DIAGNOSIS — IMO0001 Reserved for inherently not codable concepts without codable children: Secondary | ICD-10-CM

## 2015-04-11 DIAGNOSIS — O24419 Gestational diabetes mellitus in pregnancy, unspecified control: Secondary | ICD-10-CM

## 2015-04-11 NOTE — Progress Notes (Signed)
Patient here for NST only.

## 2015-04-11 NOTE — Progress Notes (Signed)
04/11/2015 NST reviewed and reactive 

## 2015-04-15 ENCOUNTER — Ambulatory Visit (INDEPENDENT_AMBULATORY_CARE_PROVIDER_SITE_OTHER): Payer: Self-pay | Admitting: *Deleted

## 2015-04-15 VITALS — BP 108/68 | HR 71

## 2015-04-15 DIAGNOSIS — IMO0001 Reserved for inherently not codable concepts without codable children: Secondary | ICD-10-CM

## 2015-04-15 DIAGNOSIS — O24419 Gestational diabetes mellitus in pregnancy, unspecified control: Secondary | ICD-10-CM

## 2015-04-15 NOTE — Progress Notes (Signed)
Interpreter Sara Schwartz present for encounter.  AFV is low normal. Pt encouraged to drink at least 6-8 glasses of water daily. US for growth/BPP scheduled 5/26.

## 2015-04-18 ENCOUNTER — Ambulatory Visit (INDEPENDENT_AMBULATORY_CARE_PROVIDER_SITE_OTHER): Payer: Self-pay | Admitting: Obstetrics & Gynecology

## 2015-04-18 ENCOUNTER — Other Ambulatory Visit: Payer: Self-pay | Admitting: Obstetrics & Gynecology

## 2015-04-18 VITALS — BP 104/70 | HR 73 | Wt 190.8 lb

## 2015-04-18 DIAGNOSIS — O24419 Gestational diabetes mellitus in pregnancy, unspecified control: Secondary | ICD-10-CM

## 2015-04-18 DIAGNOSIS — IMO0001 Reserved for inherently not codable concepts without codable children: Secondary | ICD-10-CM

## 2015-04-18 DIAGNOSIS — O09523 Supervision of elderly multigravida, third trimester: Secondary | ICD-10-CM

## 2015-04-18 LAB — POCT URINALYSIS DIP (DEVICE)
BILIRUBIN URINE: NEGATIVE
Glucose, UA: NEGATIVE mg/dL
Hgb urine dipstick: NEGATIVE
KETONES UR: NEGATIVE mg/dL
Nitrite: NEGATIVE
PH: 5.5 (ref 5.0–8.0)
Protein, ur: NEGATIVE mg/dL
Specific Gravity, Urine: 1.01 (ref 1.005–1.030)
Urobilinogen, UA: 0.2 mg/dL (ref 0.0–1.0)

## 2015-04-18 LAB — OB RESULTS CONSOLE GC/CHLAMYDIA
Chlamydia: NEGATIVE
Gonorrhea: NEGATIVE

## 2015-04-18 LAB — OB RESULTS CONSOLE GBS: GBS: POSITIVE

## 2015-04-18 NOTE — Progress Notes (Signed)
Interpreter Pearletha AlfredMaria Elena Jimenez present for encounter.  IOL scheduled 5/31 @ 0630

## 2015-04-18 NOTE — Progress Notes (Signed)
FBS almost all in range and P P doing well too. Nst reactive today  GBS and GC CT

## 2015-04-18 NOTE — Patient Instructions (Signed)
Induccin del trabajo de parto  (Labor Induction ) Se denomina induccin del trabajo de parto cuando se inician acciones para hacer que una mujer embarazada comience el trabajo de parto. La mayora de las mujeres comienzan el trabajo de parto sin ayuda entre las semanas 37 y 42 del embarazo. Cuando esto no ocurre o cuando hay una necesidad mdica, pueden utilizarse diferentes mtodos para inducirlo. La induccin del trabajo de parto hace que el tero se contraiga. Tambin hace que el cuello del tero se ablandemadure), se abra (se dilate), y se afine (se borre). Generalmente el trabajo de parto no se induce antes de las 39 semanas excepto que haya un problema con el beb o con la madre.  Antes de inducir el trabajo de parto, el mdico considerar cierto nmero de factores incluyendo los siguientes:  El estado del beb.  Cuntas semanas tiene de embarazo.  La madurez de los pulmones del beb.  El estado del cuello del tero.  La posicin del beb. CULES SON LOS MOTIVOS PARA INDUCIR UN PARTO? El trabajo de parto puede inducirse por las siguientes razones:  La salud del beb o de la madre estn en riesgo.  El embarazo se ha pasado de trmino en 1 semana o ms.  Ha roto la bolsa de aguas pero no se ha iniciado el trabajo de parto por s mismo.  La madre tiene algn trastorno de salud o una enfermedad grave, como hipertensin arterial, una infeccin, desprendimiento abrupto de la placenta o diabetes.  Hay escaso lquido amnitico alrededor del beb.  El beb presenta sufrimiento. La conveniencia o el deseo de que el beb nazca en una cierta fecha no es un motivo para inducir el parto. CULES SON LOS MTODOS UTILIZADOS PARA INDUCIR EL TRABAJO DE PARTO? Algunos mtodos de induccin del trabajo de parto son:   Administracin del medicamentos prostaglandina. Este medicamento hace que el cuello uterino se dilate y madure. Este medicamento tambin iniciar las contracciones. Puede tomarse por  boca o insertarse en la vagina en forma de supositorio.  Insercin en la vagina de un tubo delgado (catter) con un baln en el extremo para dilatar el cuello del tero. Una vez insertado, el baln se infla con agua, lo que provoca la apertura del cuello del tero.  Ruptura de las membranas. El mdico separa el saco amnitico del cuello uterino, haciendo que el cuello uterino se distienda y cause la liberacin de la hormona llamada progesterona. Esto hace que el tero se contraiga. Este procedimiento se realiza durante una visita al consultorio mdico. Le indicarn que vuelva a su casa y espere que se inicien las contracciones. Luego tendr que volver para la induccin.  Ruptura de la bolsa de aguas. El mdico romper el saco amnitico con un pequeo instrumento. Una vez que el saco amnitico se rompe, las contracciones deben comenzar. Pueden pasar algunas horas hasta que haga efecto.  Medicamentos que desencadenen o intensifiquen las contracciones. Se lo administrarn a travs de un catter por va intravenosa (IV) que se inserta en una de las venas del brazo. Todos los mtodos de induccin, excepto la ruptura de membranas, se realizan en el hospital. La induccin se realizar en el hospital, de modo que usted y el beb puedan ser controlados cuidadosamente.  CUNTO TIEMPO LLEVA INDUCIR EL TRABAJO DE PARTO? Algunas inducciones pueden demorar entre 2 y 3 das. Generalmente lleva menos tiempo, dependiendo del estado del cuello del tero. Puede tomar ms tiempo si la induccin se realiza en etapas tempranas del embarazo   o es su primer embarazo. Si han pasado 2 o 3 das y no se inicia el trabajo de parto, podrn enviarla a su casa o realizar una cesrea. CULES SON LOS RIESGOS ASOCIADOS CON LA INDUCCiN DEL TRABAJO DE PARTO? Algunos de los riesgos de la induccin son:   Cambios en la frecuencia cardaca fetal, por ejemplo los latidos son demasiado rpidos, o lentos, o errticos.  Riesgo de distrs  fetal.  Posibilidad de infeccin en la madre o el beb.  Aumento de la posibilidad de que sea necesaria una cesrea.  Ruptura (abrupcin) de la placenta del tero (raro).  Ruptura uterina (muy raro). Cuando es necesario realizar la induccin por razones mdicas, los beneficios deben superar a los riesgos. CULES SON ALGUNAS RAZONES PARA NO INDUCIR EL TRABAJO DE PARTO? La induccin no debe realizarse si:   Se demuestra que el beb no tolera el trabajo de parto.  Fue sometida anteriormente a cirugas en el tero, como una miomectoma o le han extirpado fibromas.  La placenta est en una posicin muy baja en el tero y obstruye la abertura del cuello (placenta previa).  El beb no est ubicado con la cabeza hacia bajo.  El cordn umbilical cae hacia el canal de parto, adelante del beb. Esto puede cortar el suministro de sangre y oxgeno al beb.  Fue sometida a una cesrea anteriormente.  Hay circunstancias poco habituales, como que el beb es extremadamente prematuro. Document Released: 02/26/2008 Document Revised: 07/22/2013 ExitCare Patient Information 2015 [employer]. This information is not intended to replace advice given to you by your health care provider. Make sure you discuss any questions you have with your health care provider.  

## 2015-04-19 LAB — GC/CHLAMYDIA PROBE AMP
CT PROBE, AMP APTIMA: NEGATIVE
GC Probe RNA: NEGATIVE

## 2015-04-20 NOTE — Progress Notes (Signed)
NST reactive.

## 2015-04-21 ENCOUNTER — Ambulatory Visit (INDEPENDENT_AMBULATORY_CARE_PROVIDER_SITE_OTHER): Payer: Self-pay | Admitting: *Deleted

## 2015-04-21 VITALS — BP 105/62 | HR 69

## 2015-04-21 DIAGNOSIS — O24419 Gestational diabetes mellitus in pregnancy, unspecified control: Secondary | ICD-10-CM

## 2015-04-21 DIAGNOSIS — IMO0001 Reserved for inherently not codable concepts without codable children: Secondary | ICD-10-CM

## 2015-04-21 NOTE — Progress Notes (Signed)
Interpreter Marly Adams present for encounter 

## 2015-04-22 LAB — CULTURE, BETA STREP (GROUP B ONLY)

## 2015-04-22 NOTE — Progress Notes (Signed)
NST performed today was reviewed and was found to be reactive.  AFI  is normal at 11.2 cm. Continue recommended antenatal testing and prenatal care.

## 2015-04-25 ENCOUNTER — Ambulatory Visit (INDEPENDENT_AMBULATORY_CARE_PROVIDER_SITE_OTHER): Payer: Self-pay | Admitting: Obstetrics and Gynecology

## 2015-04-25 VITALS — BP 124/76 | HR 74 | Wt 190.7 lb

## 2015-04-25 DIAGNOSIS — O09523 Supervision of elderly multigravida, third trimester: Secondary | ICD-10-CM

## 2015-04-25 DIAGNOSIS — O24419 Gestational diabetes mellitus in pregnancy, unspecified control: Secondary | ICD-10-CM

## 2015-04-25 DIAGNOSIS — O09899 Supervision of other high risk pregnancies, unspecified trimester: Secondary | ICD-10-CM

## 2015-04-25 DIAGNOSIS — IMO0001 Reserved for inherently not codable concepts without codable children: Secondary | ICD-10-CM

## 2015-04-25 DIAGNOSIS — Z283 Underimmunization status: Secondary | ICD-10-CM

## 2015-04-25 DIAGNOSIS — O9989 Other specified diseases and conditions complicating pregnancy, childbirth and the puerperium: Secondary | ICD-10-CM

## 2015-04-25 LAB — POCT URINALYSIS DIP (DEVICE)
Bilirubin Urine: NEGATIVE
Glucose, UA: NEGATIVE mg/dL
KETONES UR: NEGATIVE mg/dL
Nitrite: NEGATIVE
Protein, ur: NEGATIVE mg/dL
SPECIFIC GRAVITY, URINE: 1.015 (ref 1.005–1.030)
UROBILINOGEN UA: 0.2 mg/dL (ref 0.0–1.0)
pH: 6 (ref 5.0–8.0)

## 2015-04-25 NOTE — Progress Notes (Signed)
OBF/NST Interpreter Pearletha AlfredMaria Elena Jimenez Hgb: TRace, Leukocytes: Moderate Pt desires cervical exam

## 2015-04-25 NOTE — Progress Notes (Signed)
39 y.o. [id number] at 5375w0d today 1. A2GDM, well controlled on glyburide 2.5 mg nightly. Blood sugars mostly at goal. All AM fasting wnl, only 3 postprandial values above goal. Continue glyburide 2.5 mg nightly. Continue diet/exercise. NST today 140/mod/+A/-D. Continue twice weekly testing. Has growth ultrasound scheduled for this Thursday. Induction of labor scheduled for 8429w0d.  2. Routine PNC. FM/VB/Labor precautions given.   3. GBS positive.

## 2015-04-28 ENCOUNTER — Ambulatory Visit (INDEPENDENT_AMBULATORY_CARE_PROVIDER_SITE_OTHER): Payer: Self-pay | Admitting: *Deleted

## 2015-04-28 ENCOUNTER — Telehealth (HOSPITAL_COMMUNITY): Payer: Self-pay | Admitting: *Deleted

## 2015-04-28 ENCOUNTER — Ambulatory Visit (HOSPITAL_COMMUNITY)
Admission: RE | Admit: 2015-04-28 | Discharge: 2015-04-28 | Disposition: A | Discharge status: Home / Self Care | Payer: Self-pay | Source: Ambulatory Visit | Attending: Obstetrics & Gynecology | Admitting: Obstetrics & Gynecology

## 2015-04-28 DIAGNOSIS — IMO0001 Reserved for inherently not codable concepts without codable children: Secondary | ICD-10-CM

## 2015-04-28 DIAGNOSIS — O24419 Gestational diabetes mellitus in pregnancy, unspecified control: Secondary | ICD-10-CM | POA: Insufficient documentation

## 2015-04-28 NOTE — Telephone Encounter (Signed)
Preadmission screen Interpreter number [phone]

## 2015-04-28 NOTE — Progress Notes (Signed)
Interpreter Job FoundsSylvia Schwartz present for encounter. Labor sx reviewed.  IOL 5/31.

## 2015-04-28 NOTE — Progress Notes (Signed)
NST reviewed and reactive.  Detron Carras L. Harraway-Smith, M.D., FACOG    

## 2015-05-03 ENCOUNTER — Encounter (HOSPITAL_COMMUNITY): Payer: Self-pay

## 2015-05-03 ENCOUNTER — Inpatient Hospital Stay (HOSPITAL_COMMUNITY)
Admission: RE | Admit: 2015-05-03 | Discharge: 2015-05-05 | DRG: 775 | Disposition: A | Discharge status: Home / Self Care | Payer: Medicaid Other | Source: Ambulatory Visit | Attending: Family Medicine | Admitting: Family Medicine

## 2015-05-03 VITALS — BP 103/53 | HR 59 | Temp 97.9°F | Resp 19 | Ht 64.0 in | Wt 190.0 lb

## 2015-05-03 DIAGNOSIS — O09523 Supervision of elderly multigravida, third trimester: Secondary | ICD-10-CM | POA: Diagnosis not present

## 2015-05-03 DIAGNOSIS — O24429 Gestational diabetes mellitus in childbirth, unspecified control: Secondary | ICD-10-CM | POA: Diagnosis present

## 2015-05-03 DIAGNOSIS — Z79899 Other long term (current) drug therapy: Secondary | ICD-10-CM | POA: Diagnosis not present

## 2015-05-03 DIAGNOSIS — O99824 Streptococcus B carrier state complicating childbirth: Secondary | ICD-10-CM | POA: Diagnosis present

## 2015-05-03 DIAGNOSIS — O9952 Diseases of the respiratory system complicating childbirth: Secondary | ICD-10-CM | POA: Diagnosis present

## 2015-05-03 DIAGNOSIS — Z3A39 39 weeks gestation of pregnancy: Secondary | ICD-10-CM | POA: Diagnosis present

## 2015-05-03 DIAGNOSIS — O24419 Gestational diabetes mellitus in pregnancy, unspecified control: Secondary | ICD-10-CM

## 2015-05-03 DIAGNOSIS — Z794 Long term (current) use of insulin: Secondary | ICD-10-CM | POA: Diagnosis not present

## 2015-05-03 DIAGNOSIS — O99519 Diseases of the respiratory system complicating pregnancy, unspecified trimester: Secondary | ICD-10-CM

## 2015-05-03 DIAGNOSIS — IMO0001 Reserved for inherently not codable concepts without codable children: Secondary | ICD-10-CM

## 2015-05-03 DIAGNOSIS — J45909 Unspecified asthma, uncomplicated: Secondary | ICD-10-CM | POA: Diagnosis present

## 2015-05-03 DIAGNOSIS — N870 Mild cervical dysplasia: Secondary | ICD-10-CM

## 2015-05-03 LAB — CBC
HCT: 33.9 % — ABNORMAL LOW (ref 36.0–46.0)
Hemoglobin: 11.1 g/dL — ABNORMAL LOW (ref 12.0–15.0)
MCH: 24 pg — AB (ref 26.0–34.0)
MCHC: 32.7 g/dL (ref 30.0–36.0)
MCV: 73.2 fL — AB (ref 78.0–100.0)
PLATELETS: 234 10*3/uL (ref 150–400)
RBC: 4.63 MIL/uL (ref 3.87–5.11)
RDW: 17.1 % — ABNORMAL HIGH (ref 11.5–15.5)
WBC: 9.3 10*3/uL (ref 4.0–10.5)

## 2015-05-03 LAB — RPR: RPR Ser Ql: NONREACTIVE

## 2015-05-03 LAB — TYPE AND SCREEN
ABO/RH(D): O POS
Antibody Screen: NEGATIVE

## 2015-05-03 LAB — GLUCOSE, CAPILLARY
GLUCOSE-CAPILLARY: 88 mg/dL (ref 65–99)
Glucose-Capillary: 102 mg/dL — ABNORMAL HIGH (ref 65–99)

## 2015-05-03 LAB — ABO/RH: ABO/RH(D): O POS

## 2015-05-03 MED ORDER — ONDANSETRON HCL 4 MG PO TABS: 4.0000 mg | ORAL_TABLET | ORAL | Status: DC | PRN

## 2015-05-03 MED ORDER — PENICILLIN G POTASSIUM 5000000 UNITS IJ SOLR
5.0000 10*6.[IU] | Freq: Once | INTRAVENOUS | Status: AC
  Administered 2015-05-03: 5 10*6.[IU] via INTRAVENOUS
  Filled 2015-05-03: qty 5

## 2015-05-03 MED ORDER — OXYTOCIN 40 UNITS IN LACTATED RINGERS INFUSION - SIMPLE MED
1.0000 m[IU]/min | INTRAVENOUS | Status: DC
  Administered 2015-05-03: 8 m[IU]/min via INTRAVENOUS
  Administered 2015-05-03: 10 m[IU]/min via INTRAVENOUS
  Administered 2015-05-03: 2 m[IU]/min via INTRAVENOUS
  Filled 2015-05-03: qty 1000

## 2015-05-03 MED ORDER — ONDANSETRON HCL 4 MG/2ML IJ SOLN: 4.0000 mg | INTRAMUSCULAR | Status: DC | PRN

## 2015-05-03 MED ORDER — OXYCODONE-ACETAMINOPHEN 5-325 MG PO TABS: 1.0000 | ORAL_TABLET | ORAL | Status: DC | PRN

## 2015-05-03 MED ORDER — BENZOCAINE-MENTHOL 20-0.5 % EX AERO: 1.0000 "application " | INHALATION_SPRAY | CUTANEOUS | Status: DC | PRN

## 2015-05-03 MED ORDER — TETANUS-DIPHTH-ACELL PERTUSSIS 5-2.5-18.5 LF-MCG/0.5 IM SUSP: 0.5000 mL | Freq: Once | INTRAMUSCULAR | Status: DC

## 2015-05-03 MED ORDER — FERROUS SULFATE 325 (65 FE) MG PO TABS
325.0000 mg | ORAL_TABLET | Freq: Two times a day (BID) | ORAL | Status: DC
  Administered 2015-05-04 – 2015-05-05 (×3): 325 mg via ORAL
  Filled 2015-05-03 (×3): qty 1

## 2015-05-03 MED ORDER — FLEET ENEMA 7-19 GM/118ML RE ENEM
1.0000 | ENEMA | RECTAL | Status: DC | PRN
Start: 2015-05-03 — End: 2015-05-03

## 2015-05-03 MED ORDER — OXYCODONE-ACETAMINOPHEN 5-325 MG PO TABS: 2.0000 | ORAL_TABLET | ORAL | Status: DC | PRN

## 2015-05-03 MED ORDER — TERBUTALINE SULFATE 1 MG/ML IJ SOLN
0.2500 mg | Freq: Once | INTRAMUSCULAR | Status: DC | PRN
  Filled 2015-05-03: qty 1

## 2015-05-03 MED ORDER — FENTANYL CITRATE (PF) 100 MCG/2ML IJ SOLN: 50.0000 ug | INTRAMUSCULAR | Status: DC | PRN

## 2015-05-03 MED ORDER — MEASLES, MUMPS & RUBELLA VAC ~~LOC~~ INJ: 0.5000 mL | INJECTION | Freq: Once | SUBCUTANEOUS | Status: DC

## 2015-05-03 MED ORDER — ACETAMINOPHEN 325 MG PO TABS: 650.0000 mg | ORAL_TABLET | ORAL | Status: DC | PRN

## 2015-05-03 MED ORDER — PRENATAL MULTIVITAMIN CH
1.0000 | ORAL_TABLET | Freq: Every day | ORAL | Status: DC
  Administered 2015-05-04 – 2015-05-05 (×2): 1 via ORAL
  Filled 2015-05-03 (×2): qty 1

## 2015-05-03 MED ORDER — MAGNESIUM HYDROXIDE 400 MG/5ML PO SUSP: 30.0000 mL | ORAL | Status: DC | PRN

## 2015-05-03 MED ORDER — MISOPROSTOL 200 MCG PO TABS
ORAL_TABLET | ORAL | Status: AC
  Filled 2015-05-03: qty 5

## 2015-05-03 MED ORDER — LACTATED RINGERS IV SOLN: 500.0000 mL | INTRAVENOUS | Status: DC | PRN

## 2015-05-03 MED ORDER — DIPHENHYDRAMINE HCL 25 MG PO CAPS: 25.0000 mg | ORAL_CAPSULE | Freq: Four times a day (QID) | ORAL | Status: DC | PRN

## 2015-05-03 MED ORDER — LIDOCAINE HCL (PF) 1 % IJ SOLN
30.0000 mL | INTRAMUSCULAR | Status: DC | PRN
  Filled 2015-05-03: qty 30

## 2015-05-03 MED ORDER — DIBUCAINE 1 % RE OINT: 1.0000 "application " | TOPICAL_OINTMENT | RECTAL | Status: DC | PRN

## 2015-05-03 MED ORDER — WITCH HAZEL-GLYCERIN EX PADS: 1.0000 "application " | MEDICATED_PAD | CUTANEOUS | Status: DC | PRN

## 2015-05-03 MED ORDER — ZOLPIDEM TARTRATE 5 MG PO TABS: 5.0000 mg | ORAL_TABLET | Freq: Every evening | ORAL | Status: DC | PRN

## 2015-05-03 MED ORDER — SENNOSIDES-DOCUSATE SODIUM 8.6-50 MG PO TABS
2.0000 | ORAL_TABLET | ORAL | Status: DC
  Administered 2015-05-03 – 2015-05-04 (×2): 2 via ORAL
  Filled 2015-05-03 (×2): qty 2

## 2015-05-03 MED ORDER — PENICILLIN G POTASSIUM 5000000 UNITS IJ SOLR
2.5000 10*6.[IU] | INTRAVENOUS | Status: DC
  Filled 2015-05-03 (×4): qty 2.5

## 2015-05-03 MED ORDER — CITRIC ACID-SODIUM CITRATE 334-500 MG/5ML PO SOLN
30.0000 mL | ORAL | Status: DC | PRN
Start: 2015-05-03 — End: 2015-05-03

## 2015-05-03 MED ORDER — MISOPROSTOL 200 MCG PO TABS
1000.0000 ug | ORAL_TABLET | Freq: Once | ORAL | Status: AC
  Administered 2015-05-03: 1000 ug via RECTAL

## 2015-05-03 MED ORDER — LACTATED RINGERS IV SOLN
INTRAVENOUS | Status: DC
  Administered 2015-05-03: 08:00:00 via INTRAVENOUS

## 2015-05-03 MED ORDER — OXYTOCIN 40 UNITS IN LACTATED RINGERS INFUSION - SIMPLE MED: 62.5000 mL/h | INTRAVENOUS | Status: DC

## 2015-05-03 MED ORDER — ONDANSETRON HCL 4 MG/2ML IJ SOLN: 4.0000 mg | Freq: Four times a day (QID) | INTRAMUSCULAR | Status: DC | PRN

## 2015-05-03 MED ORDER — IBUPROFEN 600 MG PO TABS
600.0000 mg | ORAL_TABLET | Freq: Four times a day (QID) | ORAL | Status: DC
  Administered 2015-05-03 – 2015-05-05 (×7): 600 mg via ORAL
  Filled 2015-05-03 (×7): qty 1

## 2015-05-03 MED ORDER — SIMETHICONE 80 MG PO CHEW: 80.0000 mg | CHEWABLE_TABLET | ORAL | Status: DC | PRN

## 2015-05-03 MED ORDER — LANOLIN HYDROUS EX OINT: 1.0000 "application " | TOPICAL_OINTMENT | CUTANEOUS | Status: DC | PRN

## 2015-05-03 MED ORDER — OXYTOCIN BOLUS FROM INFUSION: 500.0000 mL | INTRAVENOUS | Status: DC

## 2015-05-03 NOTE — Progress Notes (Signed)
Delivery of live viable female by Sara Schwartz, APGAR 9,9

## 2015-05-03 NOTE — H&P (Signed)
LABOR ADMISSION HISTORY AND PHYSICAL  Sara Schwartz is a 39 y.o. female [phone] with IUP at 41w1dby LMP/US presenting for IOL 2/2 A2GDM. - She reports contractions for few days, increased over past day about < 10 min apart, some pain and mild pelvic pressure. Admits she has been compliant with glyburide, blood sugars controlled. No other concerns. +FM - Denies LOF, VB, vision changes, HA, peripheral edema, and RUQ/epigastric abd pain.  Dating: By LMP/US --->  Estimated Date of Delivery: 04/29/15  Last [place] '@[redacted]w[redacted]d' , CWD, normal anatomy, cephalic presentation, 30233I(6 lb 12 oz), 47% EFW - Anterior placenta, no previa - Normal AFI, slightly low for GA  [facility] History[facility]Complications: - [facility] [id number] Complications with: 1) AU8HFG failed early 1hr/3hr GTT, on glyburide 2.579mqhs with controlled FBS 2) H/o Preterm Delivery (34, 29, 36 wks) - did not receive 17P 3) AMA (age 36194)38GBS Positive 5) conflicting information on rubella immune status - HD scanned records say immune 12/28/14 on original lab report - noted in problem list that non-immune and needed MMR PP, incorrect.  Prior term SVD x 3 - all 7 lbs (largest)  Past Medical History: Past Medical History  Diagnosis Date  . Asthma   . Diabetes mellitus without complication   . Gestational diabetes     Past Surgical History: Past Surgical History  Procedure Laterality Date  . Vaginal delivery      multiple  . No past surgeries      Obstetrical History: OB History    Gravida Para Term Preterm AB TAB SAB Ectopic Multiple Living   '7 6 3 3 ' 0 0 0 0 0 6      Social History: History   Social History  . Marital Status: Single    Spouse Name: N/A  . Number of Children: N/A  . Years of Education: N/A   Social History Main Topics  . Smoking status: Never Smoker   . Smokeless tobacco: Never Used  . Alcohol Use: No     Comment: socially when not pregnant  .  Drug Use: No  . Sexual Activity: Not Currently    Birth Control/ Protection: None   Other Topics Concern  . None   Social History Narrative    Family History: Family History  Problem Relation Age of Onset  . Diabetes Mother   . Hypertension Mother   . Diabetes Father   . Hypertension Father     Allergies: No Known Allergies  Prescriptions prior to admission  Medication Sig Dispense Refill Last Dose  . ACCU-CHEK FASTCLIX LANCETS MISC 1 Units by Does not apply route 4 (four) times daily. 102 each 3 Taking  . acetaminophen (TYLENOL) 500 MG tablet Take 500 mg by mouth every 6 (six) hours as needed for mild pain.   Taking  . albuterol (PROVENTIL HFA;VENTOLIN HFA) 108 (90 BASE) MCG/ACT inhaler Inhale 1-2 puffs into the lungs every 6 (six) hours as needed for wheezing or shortness of breath.   Taking  . aspirin 81 MG tablet Take 1 tablet (81 mg total) by mouth daily. 30 tablet 6 Taking  . beclomethasone (QVAR) 40 MCG/ACT inhaler Inhale 1 puff into the lungs 2 (two) times daily. 1 Inhaler 12 Taking  . Blood Glucose Monitoring Suppl (ACCU-CHEK NANO SMARTVIEW) W/DEVICE KIT 1 Device by Does not apply route 4 (four) times daily. 1 kit 0 Taking  . glucose blood (ACCU-CHEK SMARTVIEW) test strip Use as instructed 50  each 12 Taking  . glyBURIDE (DIABETA) 2.5 MG tablet Take 1 tablet (2.5 mg total) by mouth daily with supper. 30 tablet 3 Taking  . Prenatal Multivit-Min-Fe-FA (PRENATAL VITAMINS) 0.8 MG tablet Take 1 tablet by mouth daily. 30 tablet 12 Taking     Review of Systems   All systems reviewed and negative except as stated in HPI  BP 128/71 mmHg  Pulse 83  Temp(Src) 98.6 F (37 C) (Oral)  Resp 18  Ht '5\' 4"'  (1.626 m)  Wt 86.183 kg (190 lb)  BMI 32.60 kg/m2  LMP 08/02/2014 General appearance: alert and cooperative, comfortable, NAD Lungs: clear to auscultation bilaterally Heart: regular rate and rhythm Abdomen: soft, non-tender; bowel sounds normal, appropriately gravid for  GA Pelvic: adequate, normal vaginal exam Extremities: Homans sign is negative, no sign of DVT, edema DTR's +2 Presentation: cephalic   Fetal monitoringBaseline: 140 bpm, Variability: Good {> 6 bpm), Accelerations: Reactive and Decelerations: Absent Uterine activityFrequency: Irregular q 6-8 min Dilation: 3 Effacement (%): 50 Station: -2 Exam by:: Lauren M.-RN   Prenatal labs: ABO, Rh: O/Positive/-- (01/21 0000) Antibody: Negative (01/21 0000) Rubella:  immune per HD records 12/23/14 RPR: NON REAC (03/16 1552)  HBsAg: Negative (01/21 0000)  HIV: NONREACTIVE (03/16 1552)  GBS: Positive (05/16 0000)  1 hr Glucola 139 (early) / Failed 3 hr GTT Genetic screening  Declined Anatomy US Normal  Prenatal Transfer Tool  Maternal Diabetes: Yes:  Diabetes Type:  Insulin/Medication controlled Genetic Screening: Declined Maternal Ultrasounds/Referrals: Normal Fetal Ultrasounds or other Referrals:  None Maternal Substance Abuse:  No Significant Maternal Medications:  Meds include: Other: glyburide Significant Maternal Lab Results: Lab values include: Group B Strep positive  Results for orders placed or performed during the hospital encounter of 05/03/15 (from the past 24 hour(s))  CBC   Collection Time: 05/03/15  7:35 AM  Result Value Ref Range   WBC 9.3 4.0 - 10.5 K/uL   RBC 4.63 3.87 - 5.11 MIL/uL   Hemoglobin 11.1 (L) 12.0 - 15.0 g/dL   HCT 33.9 (L) 36.0 - 46.0 %   MCV 73.2 (L) 78.0 - 100.0 fL   MCH 24.0 (L) 26.0 - 34.0 pg   MCHC 32.7 30.0 - 36.0 g/dL   RDW 17.1 (H) 11.5 - 15.5 %   Platelets 234 150 - 400 K/uL  Glucose, capillary   Collection Time: 05/03/15  8:35 AM  Result Value Ref Range   Glucose-Capillary 88 65 - 99 mg/dL    Patient Active Problem List   Diagnosis Date Noted  . Gestational diabetes mellitus, class A2 05/03/2015  . Language barrier 01/12/2015  . Rubella non-immune status, antepartum 01/12/2015  . Gestational diabetes mellitus, class B1 01/10/2015  .  Supervision of high-risk pregnancy of elderly multigravida 01/10/2015  . AMA (advanced maternal age) multigravida 35+ 01/10/2015  . History of preterm delivery, currently pregnant 01/10/2015  . Asthma affecting pregnancy, antepartum 01/10/2015  . Asthma 01/10/2015  . Dysplasia of cervix, low grade (CIN 1) 01/10/2015    Assessment: Sara Schwartz is a 39 y.o. [id number] at 28w1dadmitted for IOL for A2GDM (glyburide), additionally pregnancy complicated by h/o PTD (no 17P), AMA 38 yr. - EFW: Last [place]38w3, EFW 3057g at 47%tile. Prior term babies x 3 all 7 lb  #Labor: Augmentation with Pitocin, grand multip, favorable cervix, some regular contractions. #Pain: Fentanyl IV PRN, not interested in epidural at this time (may consider) #FWB:  Cat 1 #GDM: CBG q 4 per protocol, hold glyburide #ID:  GBS positive -  start PCN prophylaxis #MOF: Breast #MOC: Considering IUD #Circ:  No circ  Nobie Putnam, [place], PGY-2 05/03/15 at [phone]  I was present for the exam and agree with above.  [place], CNM 05/03/2015 9:35 AM

## 2015-05-04 LAB — GLUCOSE, CAPILLARY: Glucose-Capillary: 120 mg/dL — ABNORMAL HIGH (ref 65–99)

## 2015-05-04 MED ORDER — PNEUMOCOCCAL VAC POLYVALENT 25 MCG/0.5ML IJ INJ
0.5000 mL | INJECTION | INTRAMUSCULAR | Status: AC
  Administered 2015-05-05: 0.5 mL via INTRAMUSCULAR
  Filled 2015-05-04 (×2): qty 0.5

## 2015-05-04 NOTE — Progress Notes (Signed)
Post Partum Day 1 Subjective:  Sara Schwartz is a 39 y.o. [id number] Unknown s/p SVD. No acute events overnight. Pt denies problems with ambulating, voiding or po intake. She denies nausea or vomiting. Pain is well controlled. She has had flatus. She has had bowel movement. Lochia Small. Plan for birth control is being discussed with husband. Method of Feeding: Breast  Objective: Blood pressure 96/57, pulse 70, temperature 98.4 F (36.9 C), temperature source Oral, resp. rate 18, height 5\' 4"  (1.626 m), weight 86.183 kg (190 lb), SpO2 98 %, unknown if currently breastfeeding.  Physical Exam:  General: alert, cooperative and no distress Lochia:normal flow Chest: CTAB Heart: RRR no m/r/g Abdomen: +BS, soft, nontender Uterine Fundus: firm, palpable 1 finger below the umbilicus DVT Evaluation: No evidence of DVT seen on physical exam. Extremities: no edema   Recent Labs (last 2 labs)      Recent Labs  05/03/15 0735  HGB 11.1*  HCT 33.9*      Assessment/Plan:  ASSESSMENT: Sara Schwartz is a 39 y.o. [id number] Unknown s/p SVD. She is recovering well.  Plan for discharge tomorrow   LOS: 1 day

## 2015-05-05 MED ORDER — IBUPROFEN 600 MG PO TABS: 600.0000 mg | ORAL_TABLET | Freq: Four times a day (QID) | ORAL | Status: DC | PRN

## 2015-05-05 NOTE — Lactation Note (Signed)
This note was copied from the chart of Sara Schwartz. Lactation Consultation Note  Patient Name: Sara Schwartz NFAOZ'HToday's Date: 05/05/2015 Reason for consult: Follow-up assessment;Other (Comment);Infant weight loss (Eda Network engineeroyal called secretary on MBU to say she was busy and could nlot come. [employer] #[id number] [name] )  Baby is at 8 % weight loss, 7 wets, 5 stools , at 40 hours 9.6, Latch scores 9-5-9 , has been to the breast 10 -20 mins and snacks , per mom milk is coming in and breast are warmer, and fuller. LC instructed mom on the use hand pump , noted the #42 Flange tight at the base , increased to #27. Mom denies soreness, sore nipple and engorgement prevention and tx reviewed. Referring to the Baby and me booklet pages 24 -25 , LC showed mom. Per mom has a friend whom speaks English that could call LC office if BF Challenges. Mother informed of post-discharge support and given phone number to the lactation department, including services for phone call assistance; out-patient appointments; and breastfeeding support group. List of other breastfeeding resources in the community given in the handout. Encouraged mother to call for problems or concerns related to breastfeeding.   Maternal Data Has patient been taught Hand Expression?: Yes  Feeding    LATCH Score/Interventions                Intervention(s): Breastfeeding basics reviewed     Lactation Tools Discussed/Used Tools: Pump;Flanges Flange Size: 27 Breast pump type: Manual WIC Program: Yes Pump Review: Setup, frequency, and cleaning;Milk Storage Initiated by:: MAI  Date initiated:: 05/05/15   Consult Status Consult Status: Complete Date: 05/05/15 Follow-up type: In-patient    [name], [name] 05/05/2015, 12:57 PM

## 2015-05-05 NOTE — Progress Notes (Signed)
UR chart review completed.  

## 2015-05-05 NOTE — Progress Notes (Signed)
Language line used for Mother and Infant teaching and all questions answered.

## 2015-05-05 NOTE — Discharge Summary (Signed)
Obstetric Discharge Summary Reason for Admission: induction of labor Prenatal Procedures: NST and ultrasound Intrapartum Procedures: spontaneous vaginal delivery and GBS prophylaxis Postpartum Procedures: none Complications-Operative and Postpartum: none  At 1:32 PM a viable female was delivered via (Presentation: OA w/ compound hand, but unsure which one due to extremely rapid delivery). Moderate amount of bloody fluid passed w/ baby. Possible abruption. APGAR: 9, 9; weight pending.  Placenta status: spontaneous, intact. Cord: 3VC with the following complications: None. Cord pH: NA. One moderate gush of blood after placenta. Firm w/ massage. Pitocin bolus infusing. Cytotec 1000 mcg placed rectally. FF. Scant bleeding.   Anesthesia: None Episiotomy: None Lacerations: Superficial abrasions. Hemostatic, no repair  Suture Repair: None Est. Blood Loss (mL): 306  Mom to postpartum. Baby to Couplet care / Skin to Skin. Placenta to: BS Feeding: Breast Circ: No Contraception: Yes, but unsure what.  Hospital Course:  Principal Problem:   Vaginal delivery Active Problems:   Gestational diabetes mellitus, class A2   Sara Schwartz is a 39 y.o. [id number] s/p SVD.  Patient was admitted on 5/31 for IOL.  She had a postpartum course that was uncomplicated. The pt feels ready to go home and  will be discharged with outpatient follow-up.   Today: No acute events overnight.  Pt denies problems with ambulating, voiding or po intake.  She denies nausea or vomiting.  Pain is well controlled.  She has had flatus. She has had bowel movement.  Lochia Small.  Plan for birth control is  undecided.  Method of Feeding: Breast  Physical Exam:  General: alert, cooperative and no distress Lochia: appropriate Uterine Fundus: firm and palpable 2 fingers below the umbilicus DVT Evaluation: No evidence of DVT seen on physical exam.  H/H: Lab Results  Component Value Date/Time   HGB 11.1*  05/03/2015 07:35 AM   HCT 33.9* 05/03/2015 07:35 AM    Discharge Diagnoses: Term Pregnancy-delivered  Discharge Information: Date: 05/05/2015 Activity: pelvic rest Diet: routine  Medications: PNV, Ibuprofen and Albuterol inhaler and beclomethasone inhaler Breast feeding:  Yes Condition: stable Instructions: refer to handout Discharge to: home  Fleeta EmmerMichael Cunningham ,MS3 05/05/2015,7:46 AM   CNM attestation I have seen and examined this patient and agree with above documentation in the medical student's note.   Sara Schwartz is a 39 y.o. [id number] s/p SVD.   Pain is well controlled.  Plan for birth control is undecided.  Method of Feeding: breast  PE:  BP 103/53 mmHg  Pulse 59  Temp(Src) 97.9 F (36.6 C) (Oral)  Resp 19  Ht 5\' 4"  (1.626 m)  Wt 86.183 kg (190 lb)  BMI 32.60 kg/m2  SpO2 99%  LMP 08/02/2014  Breastfeeding? Unknown Heart: RRR Lungs: nl effort Fundus firm  No results for input(s): HGB, HCT in the last 72 hours.   Plan: discharge today - postpartum care discussed - f/u clinic in 6 weeks for postpartum visit   SHAW, KIMBERLY, CNM 8:46 AM

## 2015-05-05 NOTE — Discharge Instructions (Signed)

## 2015-06-01 ENCOUNTER — Ambulatory Visit: Payer: Self-pay | Admitting: Obstetrics & Gynecology

## 2015-06-10 ENCOUNTER — Ambulatory Visit (INDEPENDENT_AMBULATORY_CARE_PROVIDER_SITE_OTHER): Payer: Medicaid Other | Admitting: Family Medicine

## 2015-06-10 DIAGNOSIS — J302 Other seasonal allergic rhinitis: Secondary | ICD-10-CM

## 2015-06-10 DIAGNOSIS — O24429 Gestational diabetes mellitus in childbirth, unspecified control: Secondary | ICD-10-CM

## 2015-06-10 MED ORDER — FLUTICASONE PROPIONATE 50 MCG/ACT NA SUSP: 2.0000 | Freq: Every day | NASAL | Status: DC

## 2015-06-10 MED ORDER — NORGESTIMATE-ETH ESTRADIOL 0.25-35 MG-MCG PO TABS: 1.0000 | ORAL_TABLET | Freq: Every day | ORAL | Status: DC

## 2015-06-10 NOTE — Patient Instructions (Signed)
Ethinyl Estradiol; Desogestrel tablets Qu es este medicamento? El ETINIL-ESTRADIOL; DESOGESTREL es un anticonceptivo oral. Estos productos [employer] tipos de hormonas femeninas, un estrgeno y Neomia Dear progestina. Se utilizan para evitar la ovulacin y Firefighter. Este medicamento puede ser utilizado para otros usos; si tiene alguna pregunta consulte con su proveedor de atencin mdica o con su farmacutico. MARCAS COMERCIALES DISPONIBLES: [place], [place], CAZIANT, [place], [place], [place]ogen, [place], [place], [place], [place], Ortho-Cept, [place], [place], [place], [place], VIORELE Qu le debo informar a mi profesional de la salud antes de tomar este medicamento? Necesita saber si usted presenta alguno de los siguientes problemas o situaciones: -sangrado vaginal anormal -enfermedad vascular o cogulos sanguneos -cncer de mama, cervical, endometrio, ovario, hgado o uterino -diabetes -enfermedad de la vescula biliar -enfermedad cardiaca o ataque cardiaco reciente -alta presin sangunea -niveles elevados de colesterol -enfermedad renal -enfermedad heptica -migraas -derrame cerebral -lupus eritematoso sistmico (LES) -fumador -una reaccin alrgica o inusual a los estrgenos, las progestinas, otros medicamentos, alimentos, Software engineer o conservantes -si est embarazada o buscando quedar embarazada -si est amamantando a un beb Cmo debo Visual merchandiser medicamento? Tome este medicamento por va oral. Puede tomar este medicamento con alimentos para reducir la nusea. Siga las instrucciones de la etiqueta del [place]. Tome [employer] siempre a la misma hora cada da y en el orden indicado. No tome su medicamento con una frecuencia mayor a la indicada. Hable con su pediatra para informarse acerca del uso de este medicamento en nios. Puede requerir atencin especial. Este medicamentos ha sido usado en nias que han empezados a tener perodos [place]. Usted recibir un  prospecto para el paciente para este producto con cada receta y relleno. Asegrese de leer este folleto cada vez cuidadosamente. Este folleto puede cambiar con frecuencia. Sobredosis: Pngase en contacto inmediatamente con un centro toxicolgico o una sala de urgencia si usted cree que haya tomado demasiado medicamento. ATENCIN: [employer] es solo para usted. No comparta este medicamento con nadie. Qu sucede si me olvido de una dosis? Si olvida una dosis, refirase al folleto de informacin para el paciente incluido con su medicamento para instrucciones. Si olvida ms de Janyth Pupa, quiz el medicamento no ser tan efectivo y podr Sports coach un otro mtodo anticonceptivo. Qu puede interactuar con este medicamento? -acetaminofeno -antibiticos o medicamentos para infecciones, especialmente rifampicina, rifabutina, rifapentina, griseofulvina y posiblemente penicilinas o tetraciclinas -aprepitant -cido ascrbico (vitamina C) -atorvastatina -barbitricos, tal como el fenobarbital -bosentano -carbamazepina -cafena -clofibrato -ciclosporina -dantroleno -doxercalciferol -felbamato -jugo de toronja -hidrocortisona -medicamentos para la ansiedad o para los problemas para conciliar el sueo, como diazepam o temazepam -medicamentos para la diabetes, tales como la pioglitazona -aceite mineral -modafinilo -micofenolato -nefazodona -oxcarbazepina -fenitona -prednisolona -ritonavir u otros medicamentos para tratar el VIH o el SIDA -rosuvastatina -selegilina -suplementos de isoflavonas de soya -hierba de [place] -tamoxifeno o raloxifeno -teofilina -hormonas tiroideas -topiramato -warfarina Puede ser que esta lista no menciona todas las posibles interacciones. Informe a su profesional de [employer] de [employer] productos a base de hierbas, medicamentos de [place] o suplementos nutritivos que est tomando. Si usted fuma, consume bebidas alcohlicas o si utiliza drogas  ilegales, indqueselo tambin a su profesional de [employer]. Algunas sustancias pueden interactuar con su medicamento. A qu debo estar atento al usar [employer]? Visite a su mdico o su profesional de la salud para chequear su evolucin peridicamente. Deber hacerse exmenes de las mamas y la pelvis y exmenes de Papanicolaou en forma regular mientras est tomando este medicamento. Use un mtodo  anticonceptivo Public relations account executiveadicional durante el primer ciclo que est tomando estas tabletas. Si tiene algn motivo para pensar que est embarazada, deje de usar este medicamento de inmediato y comunquese con su mdico o con su profesional de Radiographer, therapeuticla salud. Si toma este medicamento para problemas relacionados con las hormonas, pueden pasar varios ciclos hasta que observe mejoras en su condicin. El fumar aumenta el riesgo de formacin de cogulos o de sufrir un derrame cerebral mientras toma pldoras anticonceptivas orales, especialmente si tiene ms de 35 aos. Se le recomienda enfticamente que no fume. Este medicamento puede hacer que su cuerpo retenga lquido, lo que puede provocar que se le hinchen los dedos, manos o tobillos. Su presin sangunea Manufacturing engineerpuede aumentar. Comunquese con su mdico o con su profesional de la salud si siente que est reteniendo lquido. Este medicamento puede aumentar la sensibilidad al sol. Mantngase fuera de Secretary/administratorla luz solar. Si no lo puede evitar, utilice ropa protectora y crema de Orthoptistproteccin solar. No utilice lmparas solares, camas solares ni cabinas solares. Si [place]ausa lentes de contacto y observa cambios en la visin, o si los lentes comienzan a resultarle incmodos, consulte al especialista en ojos. Algunas mujeres pueden presentar sensibilidad, hinchazn o sangrado leve de las encas. Informe a su dentista si esto sucede. Cepillarse los dientes y limpiarlos con hilo dental peridicamente puede ayudar a controlar este fenmeno. Visite peridicamente a su dentista e infrmele acerca de los  medicamentos que est tomando. Si va a someterse a Associate Professoruna operacin electiva, tal vez deba dejar de tomar este medicamento de antemano. Consulte con su profesional de la salud por asesoramiento. Este medicamento no la protege de la infeccin por VIH (SIDA) ni de ninguna otra enfermedad de transmisin sexual. Qu efectos secundarios puedo tener al utilizar este medicamento? Efectos secundarios que debe informar a su mdico o a Producer, television/film/videosu profesional de la salud tan pronto como sea posible: -Therapist, artreacciones alrgicas como erupcin cutnea, picazn o urticarias, hinchazn de la cara, labios o lengua -secreciones o cambios en el tejido de las mamas -cambios en la visin -dolor en el pecho -confusin, dificultad para hablar o entender -orina de color amarillo oscuro -sensacin general de estar enfermo o sntomas gripales -heces claras -nuseas, vmito -dolor, hinchazn, sensacin clida en las piernas -dolor en la parte superior del abdomen -dolores de cabeza severos -falta de aliento -debilidad o entumecimiento repentino de la cara, brazos o piernas -problemas para caminar, mareos, prdida de la coordinacin o equilibrio -sangrado vaginal inusual -color amarillento de los ojos o la piel Efectos secundarios que, por lo general, no requieren Psychologist, prison and probation servicesatencin mdica (debe informarlos a su mdico o a su profesional de la salud si persisten o si son molestos): -dolor de espalda -sensibilidad en las mamas -humor deprimido o cambios de humor -cada del cabello -aumento del apetito o sed -aumento de la necesidad de Geographical information systems officerorinar -retencin de lquido e hinchazn -hinchazn o calambres estomacales -sntomas de infeccin vaginal como picazn, irritacin o flujo inusual -cansancio o debilidad inusual Puede ser que esta lista no menciona todos los posibles efectos secundarios. Comunquese a su mdico por asesoramiento mdico Hewlett-Packardsobre los efectos secundarios. Usted puede informar los efectos secundarios a la FDA por telfono al  1-800-FDA-1088. Dnde debo guardar mi medicina? Mantngala fuera del alcance de los nios. Gurdela a Sanmina-SCItemperatura ambiente, entre 15 y 30 grados C (6059 y 6986 grados F). Deseche todo el medicamento que no haya utilizado, despus de la fecha de vencimiento. ATENCIN: Este folleto es un resumen. Puede ser que no cubra toda la posible informacin.  Si usted tiene preguntas acerca de esta medicina, consulte con su mdico, su farmacutico o su profesional de Radiographer, therapeutic.  2015, Elsevier/Gold Standard. (2009-01-07 16:10:96)

## 2015-06-10 NOTE — Progress Notes (Deleted)
Patient ID: Teressa LowerCandelaria Lopez-Cruz, female   DOB: 01-21-1976, 39 y.o.   MRN: [id number]    Subjective: CC: 6 week PP visit HPI: Patient is a 39 y.o. female presenting to clinic today for follow up. Concerns today include:  Has been doing well since delivery.  She has stopped bleeding about 2 weeks ago.  She is breast feeding.  No concerns except some allergy symptoms.  ROS: All other systems reviewed and are negative.  Objective: Office vital signs reviewed. There were no vitals taken for this visit.  Physical Examination:  General: Awake, alert, well nourished, well appearing, NAD HEENT: Normal, EOMI Cardio: RRR, S1S2 heard, no murmurs appreciated Pulm: CTAB, no wheezes, rhonchi or rales GI: soft, NT/ND,+BS x4, no hepatomegaly, no splenomegaly GU: deffered Extremities: WWP, No edema, cyanosis or clubbing; +2 pulses bilaterally MSK: Normal gait and station  Assessment: 39 y.o. female ***  Plan: See Problem List and After Visit Summary   Raliegh IpAshly M Janautica Netzley, DO PGY-2, [facility]

## 2015-06-10 NOTE — Progress Notes (Signed)
Patient ID: Sara Schwartz, female   DOB: Aug 01, 1976, 39 y.o.   MRN: [id number] Subjective:     Sara Schwartz is a 39 y.o. female who presents for a postpartum visit. She is 5 weeks postpartum following a spontaneous vaginal delivery. I have fully reviewed the prenatal and intrapartum course. The delivery was at 39 gestational weeks. Outcome: spontaneous vaginal delivery. Anesthesia: none. Postpartum course has been uncomplicated. Baby's course has been uncomplicated. Baby is feeding by both breast and bottle - Similac Advance. Bleeding no bleeding. Bowel function is normal. Bladder function is normal. Patient is not sexually active. Contraception method is IUD. Postpartum depression screening: negative.  Spanish Interpreter Sara Schwartz [facility] Pt desires Paraguard IUD, has no insurance, will discuss with health department Pt desires OCP's until IUD can be placed Pt states her hair is falling out and is a concern Pt is having 2 hour gtt today, pt did drink black coffee with 1 Splenda.  Pt states she can not come next week for testing, confirmed with Sara Schwartz, CNM and order given to proceed with 2 hour gtt. The following portions of the patient's history were reviewed and updated as appropriate: allergies, current medications, past family history, past medical history, past social history, past surgical history and problem list.  Review of Systems Pertinent items are noted in HPI.   Objective:    There were no vitals taken for this visit.  General:  alert, cooperative, appears stated age and no distress   Breasts:  inspection negative, no nipple discharge or bleeding, no masses or nodularity palpable  Lungs: clear to auscultation bilaterally  Heart:  regular rate and rhythm, S1, S2 normal, no murmur, click, rub or gallop  Abdomen: soft, non-tender; bowel sounds normal; no masses,  no organomegaly   Vulva:  normal  Vagina: normal vagina, no discharge, exudate, lesion, or erythema   Cervix:  no cervical motion tenderness and no lesions  Corpus: normal size, contour, position, consistency, mobility, non-tender  Adnexa:  normal adnexa and no mass, fullness, tenderness  Rectal Exam: Not performed.        Assessment:     6 week postpartum exam. Pap smear not done at today's visit.   Plan:    1. Contraception: OCP (estrogen/progesterone) then IUD at health dept 2. Seasonal allergies: Flonase prescribed 3. A2GDM: 2 hour glucola today  4. Return precautions reviewed.  Patient to follow up with the health department for IUD placement in 1 month.  Recommend overlap with condoms for the next month.  Sara M. Nadine CountsGottschalk, DO PGY-2, [facility]

## 2015-06-11 LAB — GLUCOSE TOLERANCE, 2 HOURS
GLUCOSE, FASTING: 103 mg/dL — AB (ref 70–99)
Glucose, 2 hour: 97 mg/dL (ref 70–139)

## 2016-04-28 IMAGING — US US OB DETAIL+14 WK
1 series · 12 of 28 positions shown · non-contrast
Comparison: none

[Series 1: us ob detail +14 wk · 12 of 77 slices shown]
[im 3/77]
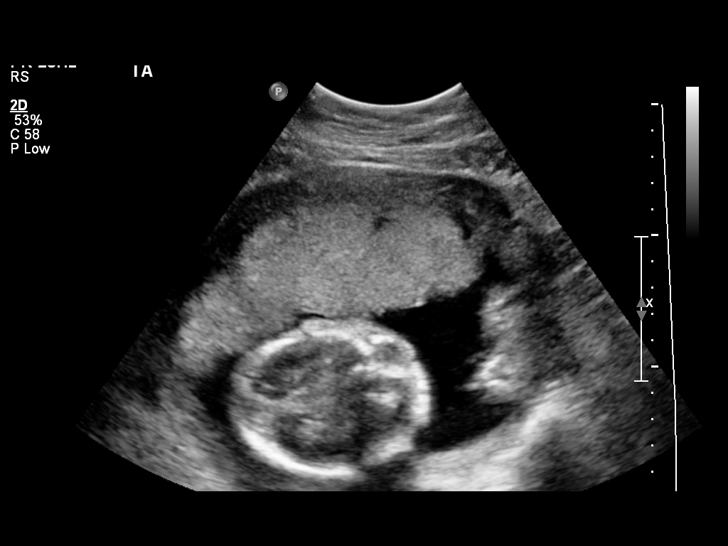
[im 9/77]
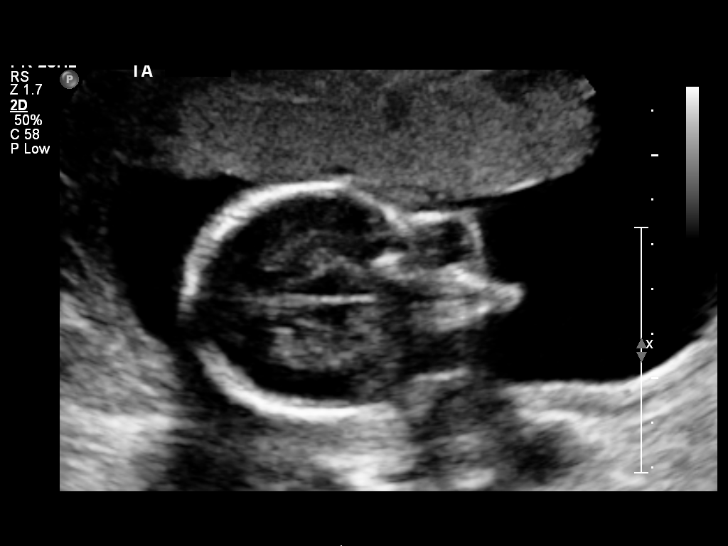
[im 15/77]
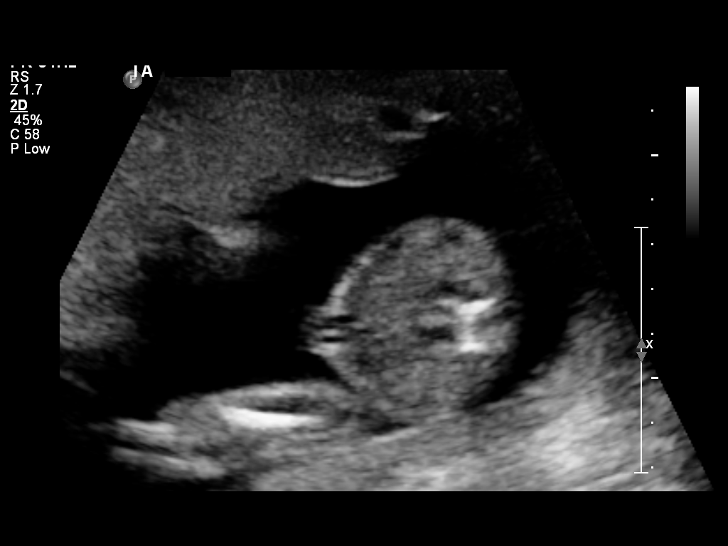
[im 23/77]
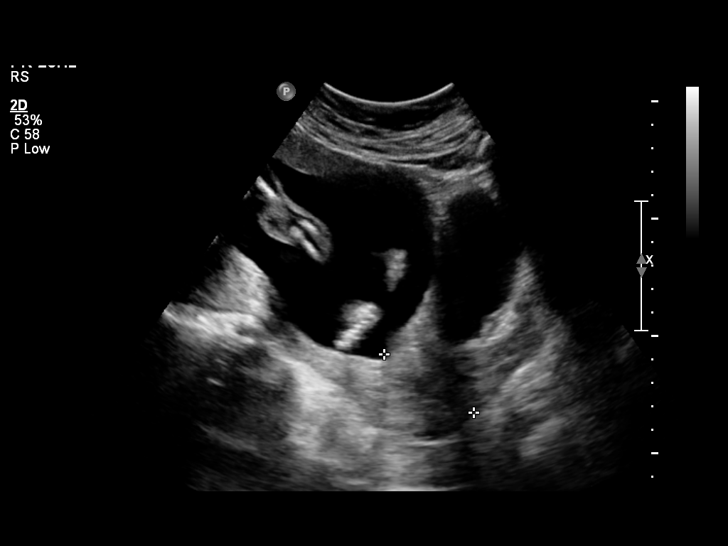
[im 29/77]
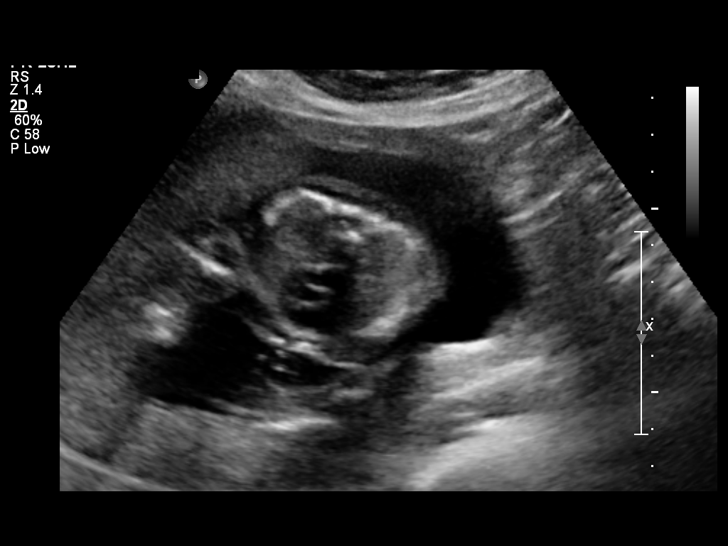
[im 34/77]
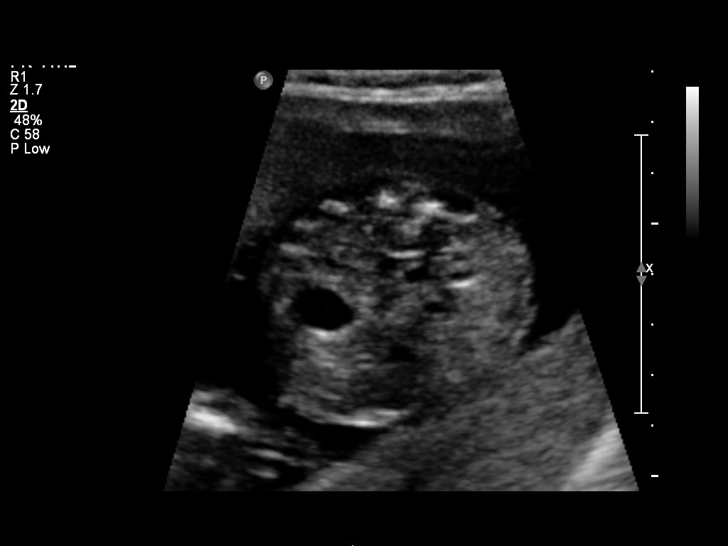
[im 43/77]
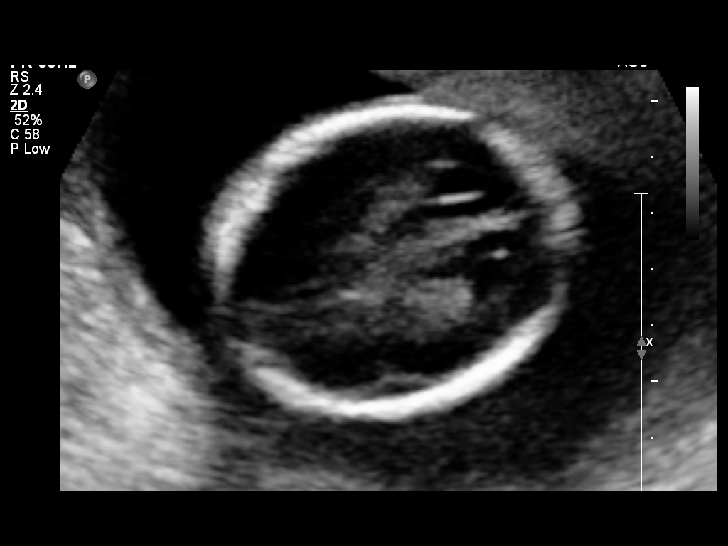
[im 48/77]
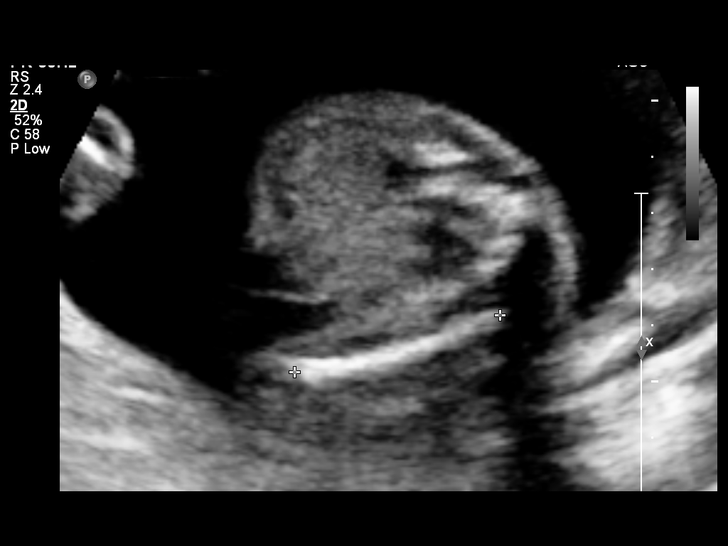
[im 54/77]
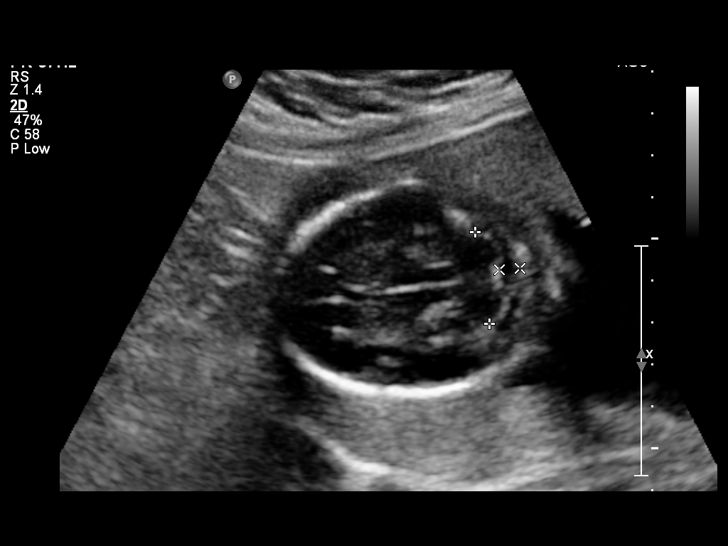
[im 62/77]
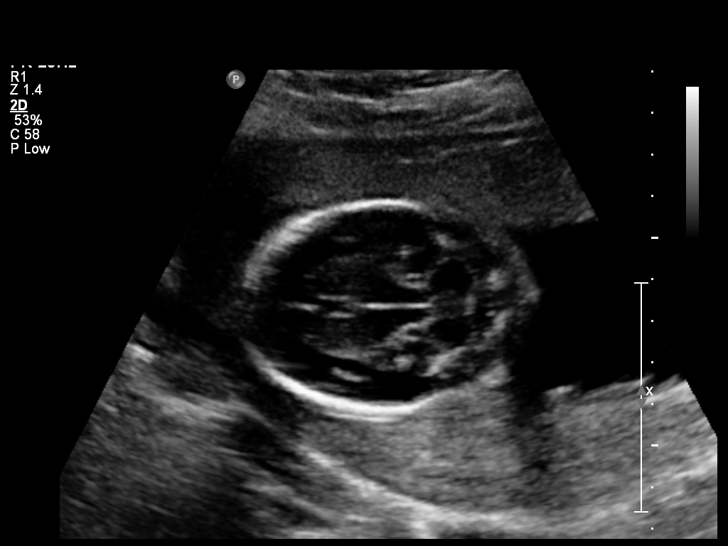
[im 68/77]
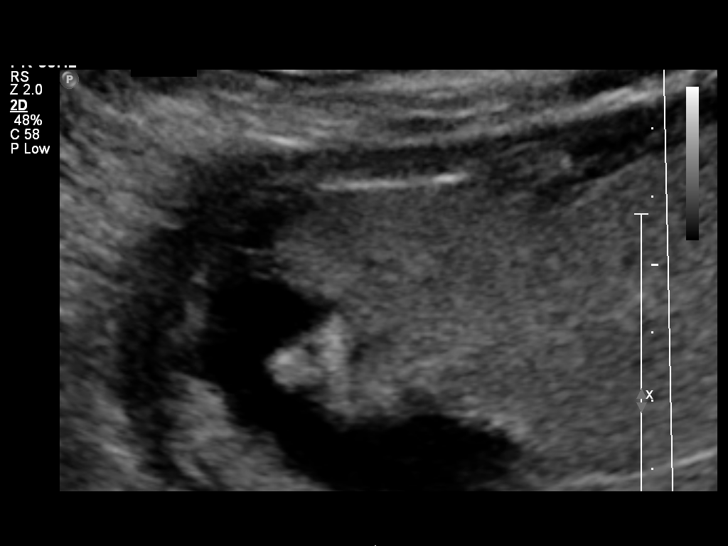
[im 74/77]
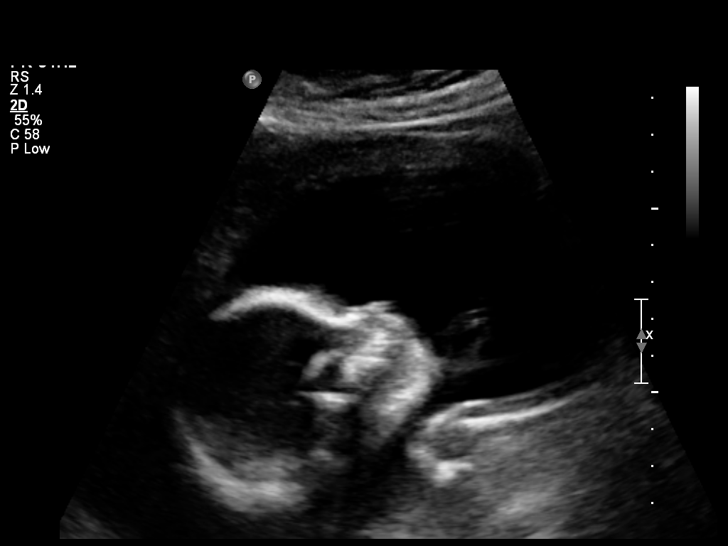

[12 of 28 positions shown; findings below may reference images not displayed]

OBSTETRICS REPORT
                      (Signed Final 12/29/2014 [DATE])

             HF

Service(s) Provided

 US OB DETAIL + 14 WK                                  76811.0
Indications

 Detailed fetal anatomic survey                        Z36
 21 weeks gestation of pregnancy
 Advanced maternal age multigravida 38, second
 trimester
 Gestational diabetes in pregnancy, diet controlled
 Poor obstetric history (prior pre-term labor)
 Poor obstetric history: Previous preterm delivery x
 3 (29/32 weeks)
Fetal Evaluation

 Num Of Fetuses:    1
 Fetal Heart Rate:  163                          bpm
 Cardiac Activity:  Observed
 Presentation:      Breech
 Placenta:          Anterior, above cervical os
 P. Cord            Visualized, central
 Insertion:

 Amniotic Fluid
 AFI FV:      Subjectively within normal limits
                                             Larg Pckt:    6.95  cm
Biometry

 BPD:     51.8  mm     G. Age:  21w 5d                CI:        71.92   70 - 86
                                                      FL/HC:      19.3   15.9 -

 HC:     194.4  mm     G. Age:  21w 4d       56  %    HC/AC:      1.19   1.06 -

 AC:     163.1  mm     G. Age:  21w 3d       46  %    FL/BPD:
 FL:      37.5  mm     G. Age:  22w 0d       64  %    FL/AC:      23.0   20 - 24
 HUM:     34.3  mm     G. Age:  21w 5d       58  %
 CER:     22.1  mm     G. Age:  21w 0d       41  %

 Est. FW:     440  gm           1 lb     52  %
Gestational Age

 LMP:           21w 2d        Date:  08/02/14                 EDD:   05/09/15
 U/S Today:     21w 5d                                        EDD:   05/06/15
 Best:          21w 2d     Det. By:  LMP  (08/02/14)          EDD:   05/09/15
Anatomy

 Cranium:          Appears normal         Aortic Arch:      Not well visualized
 Fetal Cavum:      Appears normal         Ductal Arch:      Appears normal
 Ventricles:       Appears normal         Diaphragm:        Not well visualized
 Choroid Plexus:   Appears normal         Stomach:          Appears normal, left
                                                            sided
 Cerebellum:       Appears normal         Abdomen:          Appears normal
 Posterior Fossa:  Appears normal         Abdominal Wall:   Appears nml (cord
                                                            insert, abd wall)
 Nuchal Fold:      Not applicable (>20    Cord Vessels:     Appears normal (3
                   wks GA)                                  vessel cord)
 Face:             Appears normal         Kidneys:          Appear normal
                   (orbits and profile)
 Lips:             Appears normal         Bladder:          Appears normal
 Heart:            Not well visualized    Spine:            Appears normal
 RVOT:             Not well visualized    Lower             Appears normal
                                          Extremities:
 LVOT:             Appears normal         Upper             Appears normal
                                          Extremities:

 Other:  Male gender. Technically difficult due to maternal habitus and fetal
         position.
Targeted Anatomy

 Fetal Central Nervous System
 Cisterna Magna:
Cervix Uterus Adnexa

 Cervical Length:    4.5      cm

 Cervix:       Normal appearance by transabdominal scan.
 Uterus:       No abnormality visualized.
 Cul De Sac:   No free fluid seen.

 Left Ovary:    Not visualized.
 Right Ovary:   Not visualized.
 Adnexa:     No abnormality visualized.
Impression

 Single IUP at 21w 2d
 EFW 52nd%
 No structural defects demonstrated
 Anterior placenta noted without previa
 Normal amniotic fluid volume
Recommendations

 Interval growth in 4 weeks.

## 2016-05-16 ENCOUNTER — Ambulatory Visit: Payer: Self-pay | Attending: Internal Medicine

## 2016-05-26 IMAGING — US US OB FOLLOW-UP
1 series · 12 of 28 positions shown · non-contrast
Comparison: none

[Series 1: us ob follow up · 66 acquisitions, 12 frames shown]
[im 3/66]
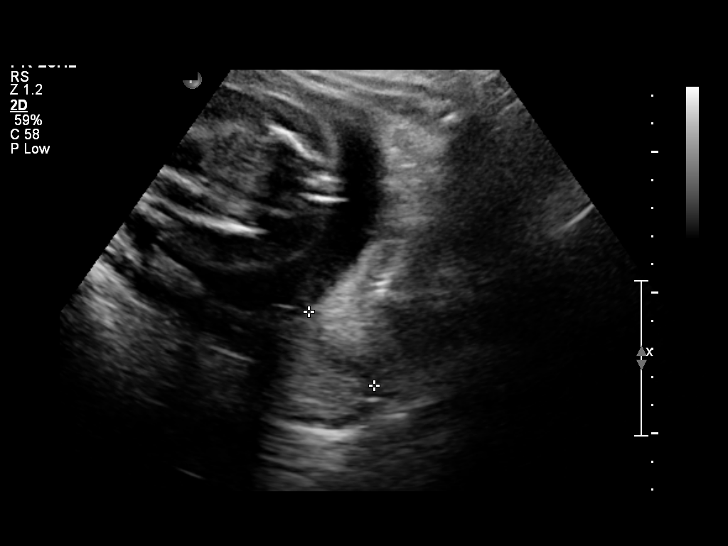
[im 8/66]
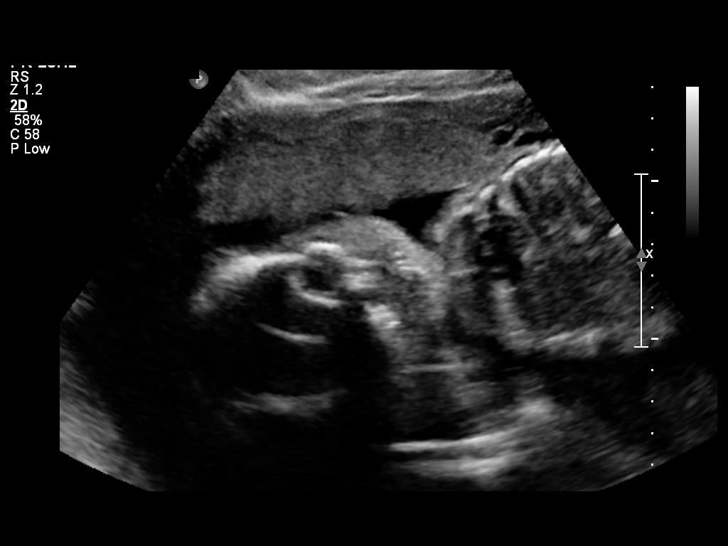
[im 13/66]
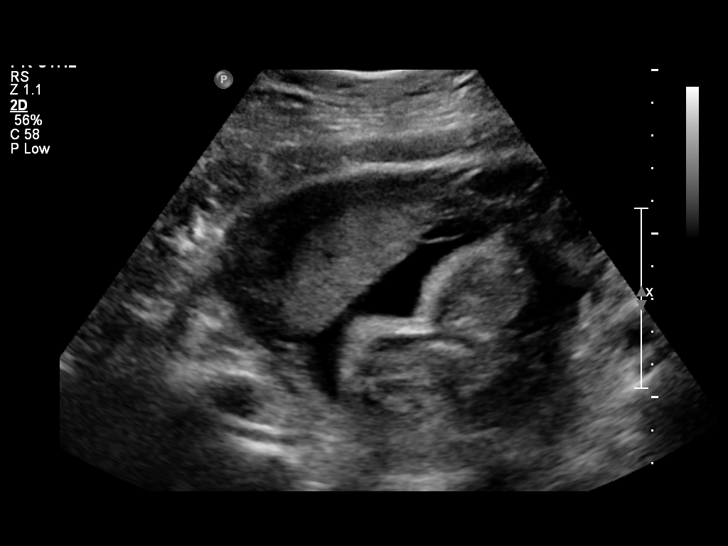
[im 20/66]
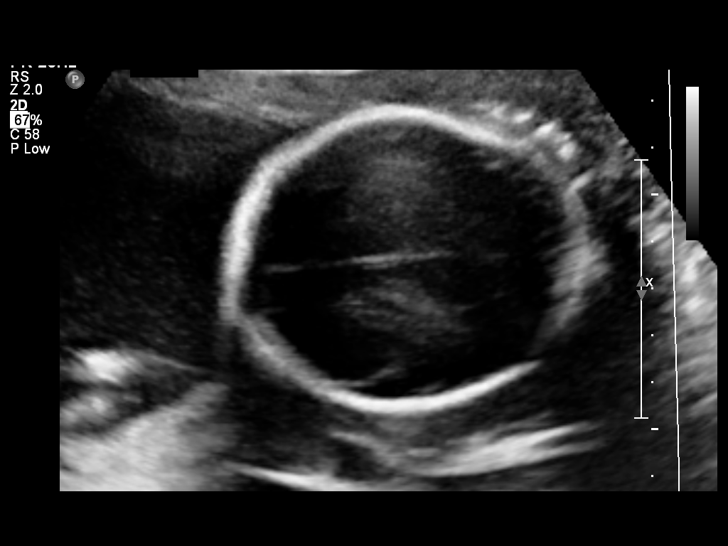
[im 25/66]
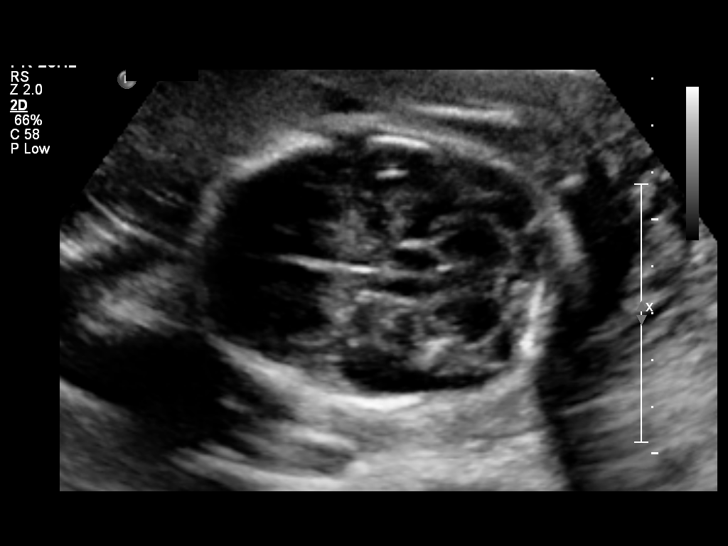
[im 29/66]
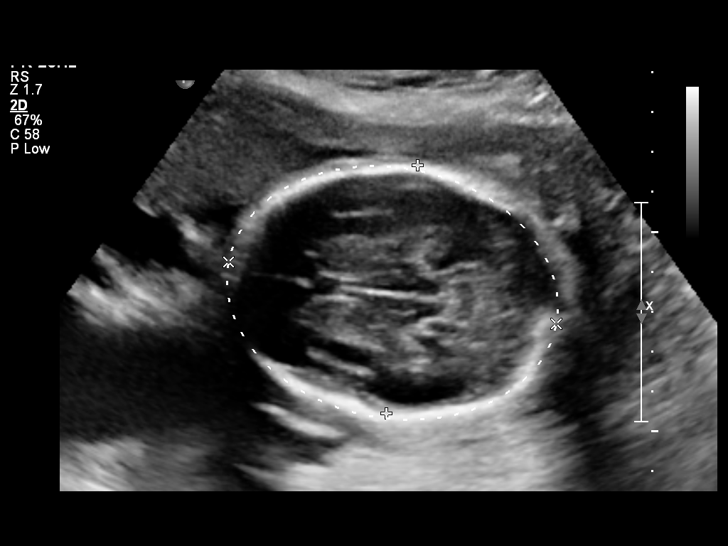
[im 37/66]
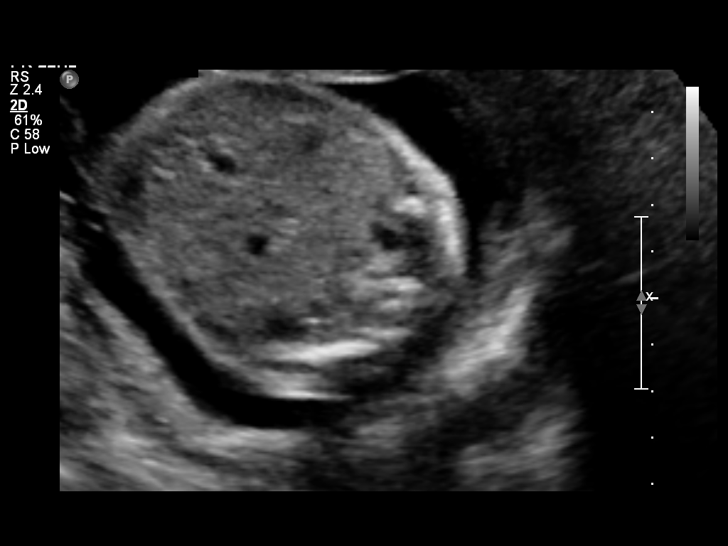
[im 41/66]
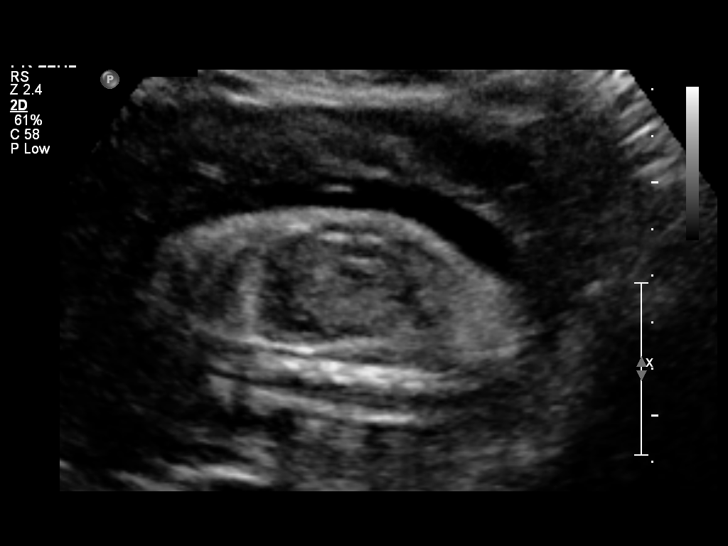
[im 46/66]
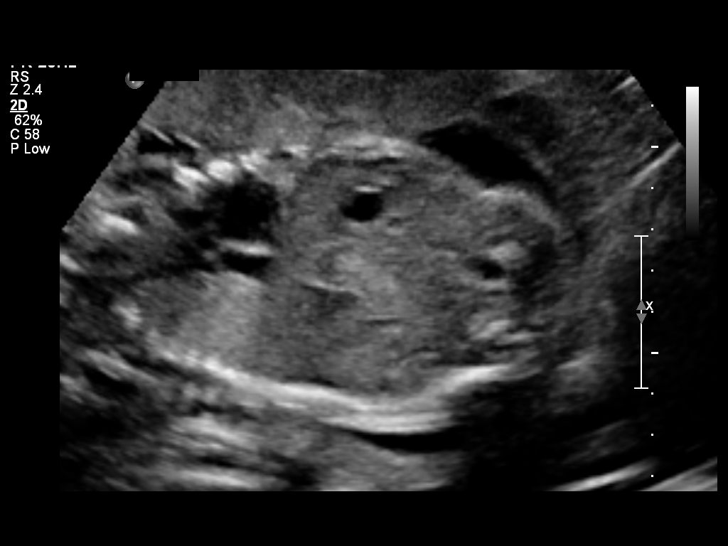
[im 53/66]
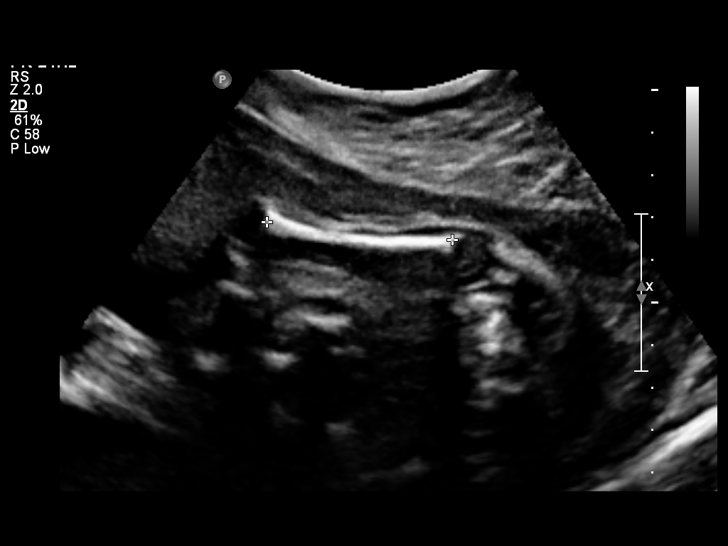
[im 58/66]
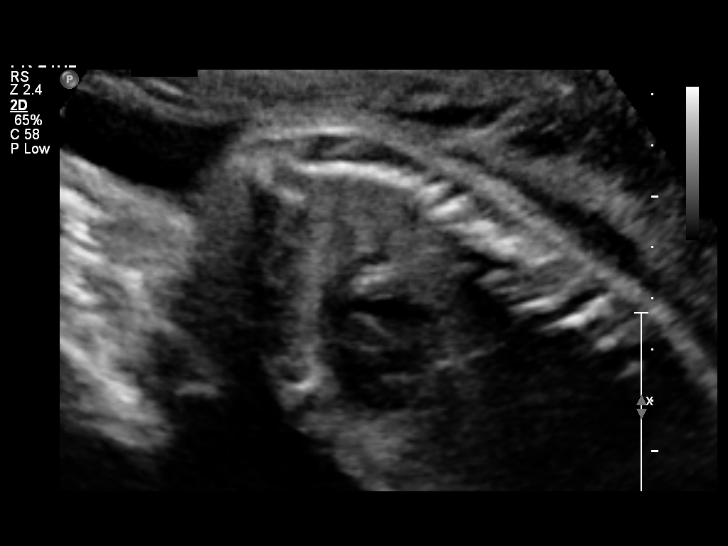
[im 63/66]
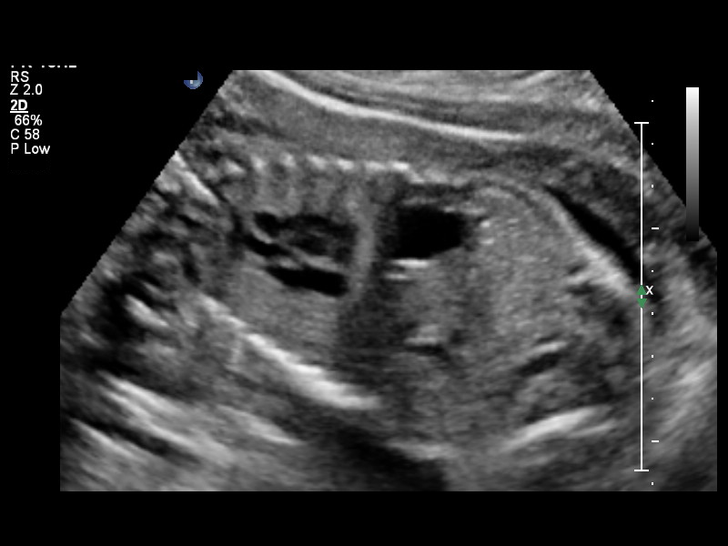

[12 of 28 positions shown; findings below may reference images not displayed]

OBSTETRICS REPORT
                      (Signed Final 01/26/2015 [DATE])

             VELNIKA

Service(s) Provided

 US OB FOLLOW UP                                       76816.1
Indications

 Advanced maternal age multigravida 38, second
 trimester
 Gestational diabetes in pregnancy, diet controlled
 Poor obstetric history: Previous preterm delivery x
 3 (29/32 weeks)
 25 weeks gestation of pregnancy
Fetal Evaluation

 Num Of Fetuses:    1
 Fetal Heart Rate:  161                          bpm
 Cardiac Activity:  Observed
 Presentation:      Breech
 Placenta:          Anterior, above cervical os
 P. Cord            Visualized, central
 Insertion:

 Amniotic Fluid
 AFI FV:      Subjectively within normal limits
                                             Larg Pckt:     6.7  cm
Biometry

 BPD:     62.5  mm     G. Age:  25w 2d                CI:         70.2   70 - 86
                                                      FL/HC:      19.7   18.7 -

 HC:     237.9  mm     G. Age:  25w 6d       48  %    HC/AC:      1.14   1.04 -

 AC:     207.8  mm     G. Age:  25w 3d       43  %    FL/BPD:     74.9   71 - 87
 FL:      46.8  mm     G. Age:  25w 4d       46  %    FL/AC:      22.5   20 - 24
 HUM:     43.5  mm     G. Age:  25w 6d       60  %

 Est. FW:     815  gm    1 lb 13 oz      57  %
Gestational Age

 LMP:           25w 2d        Date:  08/02/14                 EDD:   05/09/15
 U/S Today:     25w 4d                                        EDD:   05/07/15
 Best:          25w 2d     Det. By:  LMP  (08/02/14)          EDD:   05/09/15
Anatomy

 Cranium:          Appears normal         Aortic Arch:      Appears normal
 Fetal Cavum:      Appears normal         Ductal Arch:      Previously seen
 Ventricles:       Appears normal         Diaphragm:        Appears normal
 Choroid Plexus:   Appears normal         Stomach:          Appears normal, left
                                                            sided
 Cerebellum:       Appears normal         Abdomen:          Appears normal
 Posterior Fossa:  Appears normal         Abdominal Wall:   Previously seen
 Nuchal Fold:      Not applicable (>20    Cord Vessels:     Previously seen
                   wks GA)
 Face:             Orbits and profile     Kidneys:          Appear normal
                   previously seen
 Lips:             Previously seen        Bladder:          Appears normal
 Heart:            Appears normal         Spine:            Previously seen
                   (4CH, axis, and
                   situs)
 RVOT:             Appears normal         Lower             Previously seen
                                          Extremities:
 LVOT:             Appears normal         Upper             Previously seen
                                          Extremities:

 Other:  Fetus appears to be a male. Technically difficult due to fetal position.
Targeted Anatomy

 Fetal Central Nervous System
 Lat. Ventricles:  3.0                    Cisterna Magna:
Cervix Uterus Adnexa

 Cervical Length:    3.5      cm

 Cervix:       Normal appearance by transabdominal scan.
 Uterus:       No abnormality visualized.
 Left Ovary:    Within normal limits.
 Right Ovary:   Within normal limits.
 Adnexa:     No adnexal mass visualized.
Impression

 Single IUP at 25w 2d
 EFW 57th%
 No structural defects demonstrated
 Anterior placenta noted without previa
 Normal amniotic fluid volume
Recommendations

 Interval growth in 4 weeks.

## 2017-02-11 ENCOUNTER — Encounter (HOSPITAL_COMMUNITY): Payer: Self-pay | Admitting: *Deleted

## 2017-02-13 ENCOUNTER — Other Ambulatory Visit: Payer: Self-pay | Admitting: Obstetrics and Gynecology

## 2017-02-13 DIAGNOSIS — Z1231 Encounter for screening mammogram for malignant neoplasm of breast: Secondary | ICD-10-CM

## 2017-02-21 ENCOUNTER — Encounter (HOSPITAL_COMMUNITY): Payer: Self-pay

## 2017-02-21 ENCOUNTER — Ambulatory Visit (HOSPITAL_COMMUNITY)
Admission: RE | Admit: 2017-02-21 | Discharge: 2017-02-21 | Disposition: A | Discharge status: Home / Self Care | Payer: Self-pay | Source: Ambulatory Visit | Attending: Obstetrics and Gynecology | Admitting: Obstetrics and Gynecology

## 2017-02-21 ENCOUNTER — Ambulatory Visit
Admission: RE | Admit: 2017-02-21 | Discharge: 2017-02-21 | Disposition: A | Discharge status: Home / Self Care | Payer: No Typology Code available for payment source | Source: Ambulatory Visit | Attending: Obstetrics and Gynecology | Admitting: Obstetrics and Gynecology

## 2017-02-21 VITALS — BP 108/68 | Temp 98.8°F | Ht 63.0 in | Wt 193.0 lb

## 2017-02-21 DIAGNOSIS — R87612 Low grade squamous intraepithelial lesion on cytologic smear of cervix (LGSIL): Secondary | ICD-10-CM

## 2017-02-21 DIAGNOSIS — Z1231 Encounter for screening mammogram for malignant neoplasm of breast: Secondary | ICD-10-CM

## 2017-02-21 DIAGNOSIS — Z1239 Encounter for other screening for malignant neoplasm of breast: Secondary | ICD-10-CM

## 2017-02-21 NOTE — Progress Notes (Signed)
[facility] due to recommending a colposcopy to follow up for abnormal Pap smear. Pap smear completed 02/04/2017.  Pap Smear:  Pap smear not completed today. 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 [facility] for a colposcopy to follow up for abnormal Pap smear. Appointment scheduled for Monday, March 04, 2017 at 0800. 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 Last two Pap smears and colposcopy results are in EPIC.  Physical exam: Breasts Breasts symmetrical. No skin abnormalities bilateral breasts. No nipple retraction bilateral breasts. No nipple discharge bilateral breasts. No lymphadenopathy. No lumps palpated bilateral breasts. No complaints of pain or tenderness on exam. [facility]. Appointment scheduled for Thursday, February 21, 2017 at 1440.      Pelvic/Bimanual No Pap smear completed today since last Pap smear was 02/04/2017. Pap smear not indicated per BCCCP guidelines.   Smoking History: Patient has never smoked.  Patient Navigation: Patient education provided. [facility] services [facility]. Spanish interpreter provided.  Used Spanish interpreter [employer] Almonte from [facility].

## 2017-02-21 NOTE — Patient Instructions (Addendum)
Explained breast self awareness with Sara Schwartz. Patient did not need a Pap smear today due to last Pap smear was 02/04/2017. Explained the colposcopy to patient the recommended follow up for her abnormal Pap smear. [facility] for a colposcopy to follow up for abnormal Pap smear. Appointment scheduled for Monday, March 04, 2017 at 0800. [facility]. Appointment scheduled for Thursday, February 21, 2017 at 1440. Patient aware of appointment and will be there. Let patient know the Breast Center will follow up with her within the next couple weeks with results of mammogram by letter or phone. Sara LowerCandelaria Schwartz verbalized understanding.  Nathian Stencil, Kathaleen Maserhristine Poll, RN 1:31 PM

## 2017-02-26 ENCOUNTER — Encounter (HOSPITAL_COMMUNITY): Payer: Self-pay | Admitting: *Deleted

## 2017-03-04 ENCOUNTER — Ambulatory Visit: Payer: Self-pay | Admitting: Obstetrics & Gynecology

## 2017-03-06 ENCOUNTER — Other Ambulatory Visit (HOSPITAL_COMMUNITY): Payer: Self-pay

## 2017-03-06 DIAGNOSIS — Z0000 Encounter for general adult medical examination without abnormal findings: Secondary | ICD-10-CM

## 2017-03-08 ENCOUNTER — Other Ambulatory Visit: Payer: Self-pay

## 2017-03-08 ENCOUNTER — Ambulatory Visit: Payer: No Typology Code available for payment source | Admitting: *Deleted

## 2017-03-08 ENCOUNTER — Other Ambulatory Visit (HOSPITAL_BASED_OUTPATIENT_CLINIC_OR_DEPARTMENT_OTHER): Payer: Self-pay

## 2017-03-08 VITALS — BP 127/60 | Ht 63.0 in | Wt 191.5 lb

## 2017-03-08 DIAGNOSIS — Z0000 Encounter for general adult medical examination without abnormal findings: Secondary | ICD-10-CM

## 2017-03-08 LAB — GLUCOSE (CC13): GLUCOSE: 88 mg/dL (ref 70–140)

## 2017-03-08 NOTE — Addendum Note (Signed)
Addended by: HILL, TIFFANY A on: 03/08/2017 08:59 AM   Modules accepted: Orders  

## 2017-03-08 NOTE — Addendum Note (Signed)
Addended by: HILL, TIFFANY A on: 03/08/2017 08:58 AM   Modules accepted: Orders  

## 2017-03-08 NOTE — Progress Notes (Signed)
W[facility]woman Initial Screening  Clinical Measurement: HT: 63in WT: 191.5lb  BP: 127/60  BPx2: 122/61 fasting labs drawn today, will review with patient when they result.   Medical History: patient states she has never been diagnosed with HTN or high cholesterol, she was diabetic when she was pregnant and states her glucose levels at home have still been elevated occasionally.   Medication: Patient does not take any medications for HTN, high cholesterol or diabetes.  Blood Pressure, Self Measurement: Not applicable.   Nutrition: patient eats 1 cup of fruit daily and sometimes 1 cup of vegetables daily. She does not eat fish. She eats more than 3 ounces of whole grains daily. She drinks water only and does not watch her sodium and salt intake.   Physical Activity: patient get 30-60 minutes 2-3 times a week of moderate physical activity. She does not get any vigorous activity in.   Smoking Status: Patient is not a smoker.  Quality of Life: patient denies any bad mental health or physical health in the last 30 days   Risk Reduction and Counseling: patients goal is to increase fruit and vegeable intake. She will begin eating more salads and smoothies. She was provided with health coaching today and pamphlets.   Navigation: Patient understands there will be 2 more health coaching sessions via phone followed by a final follow up call. Will call her with her second health coaching along with her lab results in one week.

## 2017-03-09 LAB — LIPID PANEL
Chol/HDL Ratio: 2.5 ratio (ref 0.0–4.4)
Cholesterol, Total: 124 mg/dL (ref 100–199)
HDL: 49 mg/dL (ref >39)
LDL Calculated: 63 mg/dL (ref 0–99)
TRIGLYCERIDES: 61 mg/dL (ref 0–149)
VLDL Cholesterol Cal: 12 mg/dL (ref 5–40)

## 2017-03-09 LAB — HEMOGLOBIN A1C
Est. average glucose Bld gHb Est-mCnc: 111 mg/dL
HEMOGLOBIN A1C: 5.5 % (ref 4.8–5.6)

## 2017-03-13 ENCOUNTER — Telehealth: Payer: Self-pay

## 2017-03-13 NOTE — Telephone Encounter (Signed)
Health Coaching #2  Initial Screening:03/08/2017  Interpretor:Julie Sowell   Labs: Fasting- Choelsterol: 152 Triglycerides:61 LDL:63 HDL:63 Glucose:88 A1C:5.5. Patient informed her lab results are in normal range  Goal: patients goal is to increase her fruit and vegetable intake to 1 cup a day. She has done this and has increased her vegetables to 2 cups a day instead. Encouraged her to continue this.   Navigation: Time spent with patient via phone: 10 Minutes, patient understands there will be a third health coaching session within 2 weeks.

## 2017-03-20 ENCOUNTER — Telehealth: Payer: Self-pay

## 2017-03-20 NOTE — Telephone Encounter (Signed)
Health Coaching #3  Initial Screening:03/08/2017  Interpretor:Julie Sowell  Goal:Patients goal was to begin increasing her fruit and vegetable intake. She states she has been doing this and denies any problems. Encouraged her to continue   Navigation: Time spent with patient via phone: 10 Minutes, patient understands there will be a final follow up call in 4- 6 weeks.

## 2017-03-22 ENCOUNTER — Ambulatory Visit (INDEPENDENT_AMBULATORY_CARE_PROVIDER_SITE_OTHER): Payer: Self-pay | Admitting: Obstetrics & Gynecology

## 2017-03-22 ENCOUNTER — Other Ambulatory Visit (HOSPITAL_COMMUNITY)
Admission: RE | Admit: 2017-03-22 | Discharge: 2017-03-22 | Disposition: A | Discharge status: Home / Self Care | Payer: No Typology Code available for payment source | Source: Ambulatory Visit | Attending: Obstetrics & Gynecology | Admitting: Obstetrics & Gynecology

## 2017-03-22 DIAGNOSIS — N870 Mild cervical dysplasia: Secondary | ICD-10-CM

## 2017-03-22 LAB — POCT PREGNANCY, URINE: Preg Test, Ur: NEGATIVE

## 2017-03-22 NOTE — Patient Instructions (Signed)
Colposcopía, Cuidado posterior  (Colposcopy, Care After)  La colposcopía es un procedimiento en el que se utiliza una herramienta especial para magnificar la superficie del cuello del útero. También es posible que se tome una muestra de tejido (biopsia). Esta muestra se observará para identificar la presencia de cáncer cervical u otros problemas. Después del procedimiento:  · Podrá sentir algunos cólicos.  · Recuéstese algunos minutos si se siente mareada.  · Podrá tener un sangrado que debería detenerse luego de algunos días.  CUIDADOS EN EL HOGAR  · No tenga relaciones sexuales ni use tampones durante 2 o 3 días o según le hayan indicado.  · Sólo tome medicamentos como lo indique su médico.  · Continúe tomando las pastillas anticonceptivas de la forma habitual.  Averigüe los resultados de su análisis  Pregunte cuándo estarán listos los resultados del examen. Asegúrese de obtener los resultados.  SOLICITE AYUDA DE INMEDIATO SI:  · Tiene un sangrado abundante o elimina coágulos.  · Su temperatura es de 102° F (38.9° C) o mayor.  · Observa una secreción vaginal anormal.  · Tiene cólicos que no se van con los medicamentos.  · Siente mareos, vértigo o pierde el conocimiento (se desmaya).  ASEGÚRESE DE QUE:   · Comprende estas instrucciones.  · Controlará su enfermedad.  · Solicitará ayuda de inmediato si no mejora o si empeora.  Esta información no tiene como fin reemplazar el consejo del médico. Asegúrese de hacerle al médico cualquier pregunta que tenga.  Document Released: 12/22/2010 Document Revised: 02/11/2012  Elsevier Interactive Patient Education © 2017 Elsevier Inc.

## 2017-03-22 NOTE — Addendum Note (Signed)
Addended byGerome Apley on: 03/22/2017 11:55 AM   Modules accepted: Orders

## 2017-03-22 NOTE — Progress Notes (Signed)
Patient ID: Sara Schwartz, female   DOB: Jun 05, 1976, 41 y.o.   MRN: [id number]  Chief Complaint  Patient presents with  . Colposcopy    HPI Sara Schwartz is a 41 y.o. female.  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 Previous colposcopy: CIN 1 and in 2016. Prior cervical treatment: no treatment.  Past Medical History:  Diagnosis Date  . Asthma   . Diabetes mellitus without complication (HCC)   . Gestational diabetes     Past Surgical History:  Procedure Laterality Date  . NO PAST SURGERIES    . VAGINAL DELIVERY     multiple    Family History  Problem Relation Age of Onset  . Diabetes Mother   . Hypertension Mother   . Diabetes Father   . Hypertension Father     Social History Social History  Substance Use Topics  . Smoking status: Former Smoker    Quit date: 02/01/2015  . Smokeless tobacco: Never Used  . Alcohol use Yes     Comment: socially when not pregnant    No Known Allergies  Current Outpatient Prescriptions  Medication Sig Dispense Refill  . albuterol (PROVENTIL HFA;VENTOLIN HFA) 108 (90 BASE) MCG/ACT inhaler Inhale 1-2 puffs into the lungs every 6 (six) hours as needed for wheezing or shortness of breath.    . beclomethasone (QVAR) 40 MCG/ACT inhaler Inhale 1 puff into the lungs 2 (two) times daily. (Patient not taking: Reported on 02/21/2017) 1 Inhaler 12  . fluticasone (FLONASE) 50 MCG/ACT nasal spray Place 2 sprays into both nostrils daily. (Patient not taking: Reported on 02/21/2017) 16 g 0  . norgestimate-ethinyl estradiol (SPRINTEC 28) 0.25-35 MG-MCG tablet Take 1 tablet by mouth daily. (Patient not taking: Reported on 02/21/2017) 1 Package 11  . Prenatal Multivit-Min-Fe-FA (PRENATAL VITAMINS) 0.8 MG tablet Take 1 tablet by mouth daily. (Patient not taking: Reported on 02/21/2017) 30 tablet 12   No current  facility-administered medications for this visit.     Review of Systems Review of Systems  Blood pressure 134/81, pulse 75, weight 185 lb 9.6 oz (84.2 kg), unknown if currently breastfeeding.  Physical Exam Physical Exam  Data Reviewed Pap and surgical path from colposcopy 2016  Assessment    Procedure Details  The risks and benefits of the procedure and Written informed consent obtained.  Speculum placed in vagina and excellent visualization of cervix achieved, cervix swabbed x 3 with acetic acid solution. SCJ and TZ seen and no endocervical lesion, AWE anterior and posterior cervix  Specimens: ECC and 1200 and 600  Complications: none.     Plan    Specimens labelled and sent to Pathology. Call to discuss Pathology results in 2 weeks.      Scheryl Darter 03/22/2017, 11:10 AM

## 2017-03-22 NOTE — Progress Notes (Signed)
Spanish interpreter Erika present for visit 

## 2017-04-17 ENCOUNTER — Telehealth: Payer: Self-pay | Admitting: *Deleted

## 2017-04-17 NOTE — Telephone Encounter (Signed)
Per message from Dr. Debroah LoopArnold needs to be scheduled for a LEEP. Scheduled for 05/08/17 3:20pm with Dr. Debroah LoopArnold.  Need to call patient and explain.

## 2017-04-17 NOTE — Telephone Encounter (Signed)
Called [facility] with [facility] [phone] and left a message we are calling to notify your of results/appointment- please call our office.

## 2017-04-19 NOTE — Telephone Encounter (Signed)
I called Sara Schwartz with [employer]#[phone] and left a message we are calling with some information - please call our office. I also called her emergency contact number and left a message we have this number listed as emergency contact for [facility]- please ask her to call our office during office hours.

## 2017-04-25 ENCOUNTER — Encounter: Payer: Self-pay | Admitting: *Deleted

## 2017-04-25 NOTE — Telephone Encounter (Signed)
[place] [employer] Natale LayErika McReynolds and left another message we are calling with some information- please call our office.

## 2017-04-25 NOTE — Telephone Encounter (Signed)
Will send letter to notify patient.

## 2017-05-08 ENCOUNTER — Ambulatory Visit (INDEPENDENT_AMBULATORY_CARE_PROVIDER_SITE_OTHER): Payer: Self-pay | Admitting: Obstetrics & Gynecology

## 2017-05-08 ENCOUNTER — Other Ambulatory Visit (HOSPITAL_COMMUNITY)
Admission: RE | Admit: 2017-05-08 | Discharge: 2017-05-08 | Disposition: A | Discharge status: Home / Self Care | Payer: Self-pay | Source: Ambulatory Visit | Attending: Obstetrics & Gynecology | Admitting: Obstetrics & Gynecology

## 2017-05-08 DIAGNOSIS — Z3202 Encounter for pregnancy test, result negative: Secondary | ICD-10-CM

## 2017-05-08 DIAGNOSIS — N871 Moderate cervical dysplasia: Secondary | ICD-10-CM

## 2017-05-08 LAB — POCT PREGNANCY, URINE: Preg Test, Ur: NEGATIVE

## 2017-05-08 NOTE — Addendum Note (Signed)
Addended by: Garret ReddishBARNES, Ulys Favia M on: 05/08/2017 05:08 PM   Modules accepted: Orders

## 2017-05-08 NOTE — Progress Notes (Signed)
Patient identified, informed consent obtained, signed copy in chart, time out performed.  Pap smear and colposcopy reviewed.   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  Cervix visualized with the colposcope and Lugol's solution was applied Paracervical block placed.  Medium size Fischer loop used to remove cone of cervix using blend of cut and cautery on LEEP machine.  Edges/Base cauterized with Ball.  Monsel's solution used for hemostasis.  Patient tolerated procedure well.  Patient given post procedure instructions.  Follow up in 12 months for repeat pap or as needed.  Adam PhenixArnold, Voris Tigert G, MD

## 2017-05-08 NOTE — Progress Notes (Signed)
Patient scored 15 on PHQ-9. Discussed with her the possibility of speaking with Asher MuirJamie today. Patient declines at this time but was interested in coming back in a separate appointment. No HI/SI. Dr Debroah LoopArnold and Asher MuirJamie were notified.

## 2017-05-08 NOTE — Patient Instructions (Signed)
Conización del cuello del útero, cuidados posteriores °(Conization of the Cervix, Care After) °Siga estas instrucciones durante las próximas semanas. Estas indicaciones le proporcionan información acerca de cómo deberá cuidarse después del procedimiento. El médico también podrá darle instrucciones más específicas. El tratamiento se ha planificado de acuerdo a las prácticas médicas actuales, pero a veces se producen problemas. Comuníquese con el médico si tiene algún problema o tiene dudas después del procedimiento. °QUÉ ESPERAR DESPUÉS DEL PROCEDIMIENTO °Después del procedimiento, es típico tener las siguientes sensaciones: °· Si le han administrado anestesia general se sentirá mareada durante 2 o 3 horas después del procedimiento. °· Podrá sentir cólicos (similar a los cólicos menstruales) durante aproximadamente 1 semana. °· Puede tener sangrado o hemorragia vaginal leves o moderados durante 1 o 2 semanas. El sangrado no debe ser abundante (por ejemplo, no debe empapar un apósito en menos de 1 hora). °· Podrá tener una secreción vaginal oscura, similar a la borra del café. Es la pasta que le han aplicado en el cuello del útero para controlar el sangrado. Esto es normal. °La recuperación puede demorar hasta 3 semanas. °INSTRUCCIONES PARA EL CUIDADO EN EL HOGAR °· Pídale a alguna persona que la lleve a su casa luego del procedimiento. °· Solo tome los medicamentos como le indicó el médico. No tome aspirina. Puede ocasionar hemorragias. °· Durante la primera semana tome duchas. No tome baños, no practique natación ni use el jacuzzi hasta que el médico la autorice. °· No se haga duchas vaginales, no utilice tampones ni tenga relaciones sexuales hasta que el médico la autorice. °· Evite las actividades extenuantes y la práctica de ejercicios durante al menos 7 a 14 días. °· Podrá volver a su dieta normal, excepto que el médico le indique otra cosa. °· Si está estreñida, podrá: °? Tomar un laxante suave según las  indicaciones del médico. °? Agregar frutas y salvado a su dieta. °? Debe ingerir gran cantidad de líquido para mantener la orina de tono claro o color amarillo pálido. °· Cumpla con todas las visitas de control con su médico. ° °SOLICITE ATENCIÓN MÉDICA SI: °· Le aparece una erupción cutánea. °· Se siente mareada o sufre un desmayo. °· Siente náuseas. °· Tiene una secreción vaginal con mal olor. ° °SOLICITE ATENCIÓN MÉDICA DE INMEDIATO SI: °· Observa coágulos sanguíneos o una hemorragia más abundante que un período menstrual normal (por ejemplo, si empapa un apósito en menos de 1 hora) o si la hemorragia es de color rojo brillante. °· Tiene fiebre de más de 101 °F (38,3 °C) o síntomas persistentes durante más de 2 o 3 días. °· Tiene fiebre de más de 101 °F (38,3 °C) y los síntomas empeoran repentinamente. °· Siente cada vez más cólicos. °· Se desmaya. °· Siente dolor al orinar. °· La orina tiene sangre. °· Comienza a vomitar. °· El dolor no se alivia con los medicamentos. °· El dolor es intenso o empeora. ° °ASEGÚRESE DE QUE: °· Comprende estas instrucciones. °· Controlará su afección. °· Recibirá ayuda de inmediato si no mejora o si empeora. ° °Esta información no tiene como fin reemplazar el consejo del médico. Asegúrese de hacerle al médico cualquier pregunta que tenga. °Document Released: 07/22/2013 Document Revised: 11/24/2013 Document Reviewed: 05/15/2013 °Elsevier Interactive Patient Education © 2017 Elsevier Inc. ° °

## 2017-05-09 LAB — POCT PREGNANCY, URINE: Preg Test, Ur: NEGATIVE

## 2017-06-10 ENCOUNTER — Telehealth: Payer: Self-pay | Admitting: *Deleted

## 2017-06-10 ENCOUNTER — Encounter: Payer: Self-pay | Admitting: *Deleted

## 2017-06-10 NOTE — Telephone Encounter (Signed)
Per message from Dr. 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 I called patient with [employer] [phone] and left a message we are calling with some information- please call our office. I will also send certified letter.

## 2017-09-20 ENCOUNTER — Telehealth (HOSPITAL_COMMUNITY): Payer: Self-pay

## 2017-09-20 NOTE — Telephone Encounter (Signed)
[facility]OMAN Follow Up  Interpreter: Delorise RoyalsJulie Sowell  Medication: Patient does not take any medications for HTN, hyperlipidemia or diabetes.  Blood Pressure, Self Measurement: N/A  Nutrition Assessment: Patient eats 1 cup of fruit daily. She eats 1/2 cup of vegetables daily. She eats 2 or more servings of fish weekly. Patient eats 3 or more oz of whole grains daily. She drinks less than 36oz of sugar added beverages weekly. She is not watching her sodium intake.  Physical Activity: Patient gets 90 minutes of moderate and vigorous physical activity weekly.  Smoking Status: Patient does not smoke.  Quality of Life: Patient has not had any mental or physical health days that prevent her from doing her normal activities in the last 30 days.   Navigation: Patient states that she would like to renew her WISEWOMAN when she is eligible.

## 2017-09-23 ENCOUNTER — Encounter (HOSPITAL_COMMUNITY): Payer: Self-pay

## 2018-03-12 ENCOUNTER — Other Ambulatory Visit (HOSPITAL_COMMUNITY): Payer: Self-pay

## 2018-03-12 DIAGNOSIS — Z0000 Encounter for general adult medical examination without abnormal findings: Secondary | ICD-10-CM

## 2018-03-13 NOTE — Addendum Note (Signed)
Addended by: Kathreen CornfieldSMITH, Jakub Debold on: 03/13/2018 01:48 PM   Modules accepted: Orders

## 2018-03-13 NOTE — Addendum Note (Signed)
Addended by: Kathreen CornfieldSMITH, Ethelreda Sukhu on: 03/13/2018 01:47 PM   Modules accepted: Orders

## 2018-03-14 ENCOUNTER — Other Ambulatory Visit: Payer: Self-pay

## 2018-03-14 ENCOUNTER — Ambulatory Visit: Payer: Self-pay

## 2018-07-08 ENCOUNTER — Observation Stay (HOSPITAL_COMMUNITY)
Admission: AD | Admit: 2018-07-08 | Discharge: 2018-07-09 | Disposition: A | Discharge status: Home / Self Care | Payer: Self-pay | Source: Ambulatory Visit | Attending: Family Medicine | Admitting: Family Medicine

## 2018-07-08 ENCOUNTER — Encounter (HOSPITAL_COMMUNITY): Payer: Self-pay

## 2018-07-08 DIAGNOSIS — D500 Iron deficiency anemia secondary to blood loss (chronic): Principal | ICD-10-CM | POA: Insufficient documentation

## 2018-07-08 DIAGNOSIS — J45909 Unspecified asthma, uncomplicated: Secondary | ICD-10-CM | POA: Insufficient documentation

## 2018-07-08 DIAGNOSIS — Z79899 Other long term (current) drug therapy: Secondary | ICD-10-CM | POA: Insufficient documentation

## 2018-07-08 DIAGNOSIS — Z789 Other specified health status: Secondary | ICD-10-CM | POA: Diagnosis present

## 2018-07-08 DIAGNOSIS — Z758 Other problems related to medical facilities and other health care: Secondary | ICD-10-CM | POA: Diagnosis present

## 2018-07-08 DIAGNOSIS — D582 Other hemoglobinopathies: Secondary | ICD-10-CM | POA: Insufficient documentation

## 2018-07-08 DIAGNOSIS — E119 Type 2 diabetes mellitus without complications: Secondary | ICD-10-CM | POA: Insufficient documentation

## 2018-07-08 DIAGNOSIS — N938 Other specified abnormal uterine and vaginal bleeding: Secondary | ICD-10-CM

## 2018-07-08 DIAGNOSIS — N939 Abnormal uterine and vaginal bleeding, unspecified: Secondary | ICD-10-CM | POA: Diagnosis present

## 2018-07-08 DIAGNOSIS — D649 Anemia, unspecified: Secondary | ICD-10-CM | POA: Diagnosis present

## 2018-07-08 LAB — CBC
HEMATOCRIT: 22.9 % — AB (ref 36.0–46.0)
Hemoglobin: 6.1 g/dL — CL (ref 12.0–15.0)
MCH: 14.8 pg — ABNORMAL LOW (ref 26.0–34.0)
MCHC: 26.6 g/dL — ABNORMAL LOW (ref 30.0–36.0)
MCV: 55.4 fL — AB (ref 78.0–100.0)
Platelets: 359 10*3/uL (ref 150–400)
RBC: 4.13 MIL/uL (ref 3.87–5.11)
RDW: 21.5 % — AB (ref 11.5–15.5)
WBC: 10.2 10*3/uL (ref 4.0–10.5)

## 2018-07-08 LAB — PREPARE RBC (CROSSMATCH)

## 2018-07-08 MED ORDER — MEGESTROL ACETATE 40 MG PO TABS
80.0000 mg | ORAL_TABLET | Freq: Two times a day (BID) | ORAL | Status: DC
  Administered 2018-07-08 – 2018-07-09 (×2): 80 mg via ORAL
  Filled 2018-07-08 (×4): qty 2

## 2018-07-08 MED ORDER — ONDANSETRON HCL 4 MG PO TABS: 4.0000 mg | ORAL_TABLET | Freq: Four times a day (QID) | ORAL | Status: DC | PRN

## 2018-07-08 MED ORDER — ACETAMINOPHEN 325 MG PO TABS
650.0000 mg | ORAL_TABLET | Freq: Once | ORAL | Status: AC
  Administered 2018-07-08: 650 mg via ORAL
  Filled 2018-07-08: qty 2

## 2018-07-08 MED ORDER — ONDANSETRON HCL 4 MG/2ML IJ SOLN: 4.0000 mg | Freq: Four times a day (QID) | INTRAMUSCULAR | Status: DC | PRN

## 2018-07-08 MED ORDER — ALBUTEROL SULFATE (2.5 MG/3ML) 0.083% IN NEBU: 3.0000 mL | INHALATION_SOLUTION | Freq: Four times a day (QID) | RESPIRATORY_TRACT | Status: DC | PRN

## 2018-07-08 MED ORDER — DIPHENHYDRAMINE HCL 25 MG PO CAPS
25.0000 mg | ORAL_CAPSULE | Freq: Once | ORAL | Status: AC
  Administered 2018-07-08: 25 mg via ORAL
  Filled 2018-07-08: qty 1

## 2018-07-08 MED ORDER — SODIUM CHLORIDE 0.9% IV SOLUTION
Freq: Once | INTRAVENOUS | Status: AC
  Administered 2018-07-08: 21:00:00 via INTRAVENOUS

## 2018-07-08 MED ORDER — FLUTICASONE PROPIONATE 50 MCG/ACT NA SUSP
2.0000 | Freq: Every day | NASAL | Status: DC
  Administered 2018-07-09: 2 via NASAL
  Filled 2018-07-08: qty 16

## 2018-07-08 MED ORDER — ACETAMINOPHEN 650 MG RE SUPP: 650.0000 mg | Freq: Four times a day (QID) | RECTAL | Status: DC | PRN

## 2018-07-08 MED ORDER — ACETAMINOPHEN 325 MG PO TABS
650.0000 mg | ORAL_TABLET | Freq: Four times a day (QID) | ORAL | Status: DC | PRN
  Administered 2018-07-09: 650 mg via ORAL
  Filled 2018-07-08: qty 2

## 2018-07-09 LAB — CBC
HEMATOCRIT: 26.2 % — AB (ref 36.0–46.0)
HEMOGLOBIN: 7.8 g/dL — AB (ref 12.0–15.0)
MCH: 17.7 pg — AB (ref 26.0–34.0)
MCHC: 29.8 g/dL — AB (ref 30.0–36.0)
MCV: 59.5 fL — AB (ref 78.0–100.0)
Platelets: 311 10*3/uL (ref 150–400)
RBC: 4.4 MIL/uL (ref 3.87–5.11)
RDW: 24.9 % — AB (ref 11.5–15.5)
WBC: 7.2 10*3/uL (ref 4.0–10.5)

## 2018-07-09 MED ORDER — FERROUS SULFATE 325 (65 FE) MG PO TABS: 325.0000 mg | ORAL_TABLET | Freq: Two times a day (BID) | ORAL | 3 refills | Status: DC

## 2018-07-09 MED ORDER — MEGESTROL ACETATE 40 MG PO TABS: 40.0000 mg | ORAL_TABLET | Freq: Two times a day (BID) | ORAL | 2 refills | Status: DC

## 2018-07-09 NOTE — Discharge Summary (Signed)
Physician Discharge Summary  Patient ID: Sara Schwartz MRN: [id number] DOB/AGE: [birth year] 42 y.o.  Admit date: 07/08/2018 Discharge date: 07/09/2018  Admission Diagnoses:DUB with anemia  Discharge Diagnoses:  Principal Problem:   Symptomatic anemia Active Problems:   Vaginal bleeding   Language barrier   Discharged Condition: good HPI: Sara Schwartz is a 42 y.o. female with a history of intermittent vaginal bleeding for 6 months after a nexplanon insertion. Was feeling lightheaded and dizzy, went to health department where she gets her care. Had Hg there, which showed severe anemia (6-7), and sent here for admission for blood transfusion. She is currently bleeding. She is dizzy and lightheaded when she stands and walks around  This patient was discussed with the ED physician, including pertinent vitals, physical exam findings, labs, and imaging.  We also discussed care given by the ED provider.  1. Symptomatic Anemia 2. AUB 3. Language barrier a. Observation b. Discussed risks of transfusion c. CBC, Type and crossmatch, transfuse 2 units d. Recheck CBC in AM following transfusion e. Megace 80mg  BID    Hospital Course: Patient felt better after transfusion and her hemoglobin was improved. She was [facility] with [facility]   Consults: None  Significant Diagnostic Studies: labs:  CBC    Component Value Date/Time   WBC 7.2 07/09/2018 0547   RBC 4.40 07/09/2018 0547   HGB 7.8 (L) 07/09/2018 0547   HCT 26.2 (L) 07/09/2018 0547   PLT 311 07/09/2018 0547   PLT 231 12/23/2014   MCV 59.5 (L) 07/09/2018 0547   MCH 17.7 (L) 07/09/2018 0547   MCHC 29.8 (L) 07/09/2018 0547   RDW 24.9 (H) 07/09/2018 0547     Treatments: IV hydration and therapies:transfusion  2 units PRBC  Discharge Exam: Blood pressure 108/74, pulse 67, temperature 97.9 F (36.6 C), temperature source Oral, resp. rate 18, weight 91.2 kg (201  lb), SpO2 100 %, unknown if currently breastfeeding. General appearance: alert, cooperative and no distress  Disposition: Discharge disposition: 01-Home or Self Care        Allergies as of 07/09/2018   No Known Allergies     Medication List    TAKE these medications   albuterol 108 (90 Base) MCG/ACT inhaler Commonly known as:  PROVENTIL HFA;VENTOLIN HFA Inhale 1-2 puffs into the lungs every 6 (six) hours as needed for wheezing or shortness of breath.   aspirin EC 81 MG tablet Take 81 mg by mouth daily.   ferrous sulfate 325 (65 FE) MG tablet Take 1 tablet (325 mg total) by mouth 2 (two) times daily with a meal.   megestrol 40 MG tablet Commonly known as:  MEGACE Take 1 tablet (40 mg total) by mouth 2 (two) times daily.      Follow-up Information    [facility]   Specialty:  Obstetrics and Gynecology Contact information: [address] [place] [id number] [phone]         [facility] Signed: Scheryl DarterJames Arnold 07/09/2018, 1:29 PM

## 2018-07-09 NOTE — Discharge Instructions (Signed)
Sangrado uterino anormal °(Abnormal Uterine Bleeding) °Sangrado uterino anormal significa que hay un sangrado por la vagina que no es su período menstrual normal. Puede ser: °· Pérdidas de sangre o hemorragias entre los períodos. °· Hemorragias luego de tener sexo (relaciones sexuales). °· Sangrado abundante o más que lo habitual. °· Períodos que duran más que lo normal. °· Sangrado luego de la menopausia. °Hay muchos problemas que pueden ser la causa. El tratamiento dependerá de la causa del sangrado. Cualquier tipo de sangrado que no sea normal debe consultarse con el médico. °CUIDADOS EN EL HOGAR °Controle su afección para ver si hay cambios. Estas indicaciones podrán disminuir cualquier molestia que tenga: °· No use tampones ni duchas vaginales o como le haya indicado el médico. °· Cambie los apósitos con frecuencia. °Deberá hacerse exámenes pélvicos regulares y pruebas de Papanicolaou. Realice los estudios indicados según le indique su médico. °SOLICITE AYUDA SI: °· El sangrado dura más de 1 semana. °· Se siente mareada por momentos. ° °SOLICITE AYUDA DE INMEDIATO SI: °· Se desmaya. °· Tiene que cambiarse los apósitos cada 15 a 30 minutos. °· Siente dolor en el abdomen. °· Tiene fiebre. °· Se siente débil o presenta sudoración. °· Elimina coágulos grandes por la vagina. °· Siente malestar estomacal (náuseas) y devuelve (vomita). ° °ASEGÚRESE DE QUE: °· Comprende estas instrucciones. °· Controlará su afección. °· Recibirá ayuda de inmediato si no mejora o si empeora. ° °Esta información no tiene como fin reemplazar el consejo del médico. Asegúrese de hacerle al médico cualquier pregunta que tenga. °Document Released: 12/22/2010 Document Revised: 11/24/2013 Document Reviewed: 06/18/2013 °Elsevier Interactive Patient Education © 2017 Elsevier Inc. ° °

## 2018-07-09 NOTE — H&P (Signed)
OB/Gyn History and Physical  Sara Schwartz ZOX:[id number] DOB: [birth year] DOA: 07/08/2018  Referring physician: none PCP: Patient, No Pcp Per  Outpatient Specialists:   Patient Coming From: home via Health department  Chief Complaint: vaginal bleeding, symptomatic anemia  HPI: Sara Schwartz is a 42 y.o. female with a history of intermittent vaginal bleeding for 6 months after a nexplanon insertion. Was feeling lightheaded and dizzy, went to health department where she gets her care. Had Hg there, which showed severe anemia (6-7), and sent here for admission for blood transfusion. She is currently bleeding. She is dizzy and lightheaded when she stands and walks around.   Review of Systems:   Pt denies any fevers, chills, nausea, vomiting, diarrhea, constipation, abdominal pain, shortness of breath, dyspnea on exertion, orthopnea, cough, wheezing, palpitations, headache, vision changes, lightheadedness, dizziness, melena, rectal bleeding.  Review of systems are otherwise negative  Past Medical History:  Diagnosis Date  . Asthma   . Diabetes mellitus without complication (HCC)   . Gestational diabetes    Past Surgical History:  Procedure Laterality Date  . NO PAST SURGERIES    . VAGINAL DELIVERY     multiple   Social History:  reports that she quit smoking about 3 years ago. She has never used smokeless tobacco. She reports that she drinks alcohol. She reports that she does not use drugs. Patient lives at home  No Known Allergies  Family History  Problem Relation Age of Onset  . Diabetes Mother   . Hypertension Mother   . Diabetes Father   . Hypertension Father       Prior to Admission medications   Medication Sig Start Date End Date Taking? Authorizing Provider  albuterol (PROVENTIL HFA;VENTOLIN HFA) 108 (90 BASE) MCG/ACT inhaler Inhale 1-2 puffs into the lungs every 6 (six) hours as needed for wheezing or shortness of breath.    [provider]   aspirin EC 81 MG tablet Take 81 mg by mouth daily.    [provider]  beclomethasone (QVAR) 40 MCG/ACT inhaler Inhale 1 puff into the lungs 2 (two) times daily. 04/04/15   Lesly DukesLeggett, Kelly H, MD  fluticasone (FLONASE) 50 MCG/ACT nasal spray Place 2 sprays into both nostrils daily. 06/10/15   Raliegh IpGottschalk, Ashly M, DO  norgestimate-ethinyl estradiol (SPRINTEC 28) 0.25-35 MG-MCG tablet Take 1 tablet by mouth daily. 06/10/15   Raliegh IpGottschalk, Ashly M, DO  Prenatal Multivit-Min-Fe-FA (PRENATAL VITAMINS) 0.8 MG tablet Take 1 tablet by mouth daily. Patient not taking: Reported on 02/21/2017 01/10/15   Constant, Peggy, MD    Physical Exam: BP (!) 101/57 (BP Location: Left Arm)   Pulse 61   Temp 98.6 F (37 C) (Oral)   Resp 16   Wt 201 lb (91.2 kg)   SpO2 100%   BMI 35.61 kg/m   . General: Middle aged female. Awake and alert and oriented x3. No acute cardiopulmonary distress.  Marland Kitchen. HEENT: Normocephalic atraumatic.  Right and left ears normal in appearance.  Pupils equal, round, reactive to light. Extraocular muscles are intact. Sclerae anicteric and noninjected.  Moist mucosal membranes. No mucosal lesions.  . Neck: Neck supple without lymphadenopathy. No carotid bruits. No masses palpated.  . Cardiovascular: Regular rate with normal S1-S2 sounds. No murmurs, rubs, gallops auscultated. No JVD.  Marland Kitchen. Respiratory: Good respiratory effort with no wheezes, rales, rhonchi. Lungs clear to auscultation bilaterally.  No accessory muscle use. . Abdomen: Soft, nontender, nondistended. Active bowel sounds. No masses or hepatosplenomegaly  . Skin: No rashes, lesions, or  ulcerations.  Dry, warm to touch. 2+ dorsalis pedis and radial pulses. . Musculoskeletal: No calf or leg pain. All major joints not erythematous nontender.  No upper or lower joint deformation.  Good ROM.  No contractures  . Psychiatric: Intact judgment and insight. Pleasant and cooperative. . Neurologic: No focal neurological deficits. Strength is 5/5  and symmetric in upper and lower extremities.  Cranial nerves II through XII are grossly intact.           Labs on Admission: I have personally reviewed following labs and imaging studies  CBC: Recent Labs  Lab 07/08/18 2001  WBC 10.2  HGB 6.1*  HCT 22.9*  MCV 55.4*  PLT 359   Basic Metabolic Panel: No results for input(s): NA, K, CL, CO2, GLUCOSE, BUN, CREATININE, CALCIUM, MG, PHOS in the last 168 hours. GFR: CrCl cannot be calculated (Patient's most recent lab result is older than the maximum 21 days allowed.). Liver Function Tests: No results for input(s): AST, ALT, ALKPHOS, BILITOT, PROT, ALBUMIN in the last 168 hours. No results for input(s): LIPASE, AMYLASE in the last 168 hours. No results for input(s): AMMONIA in the last 168 hours. Coagulation Profile: No results for input(s): INR, PROTIME in the last 168 hours. Cardiac Enzymes: No results for input(s): CKTOTAL, CKMB, CKMBINDEX, TROPONINI in the last 168 hours. BNP (last 3 results) No results for input(s): PROBNP in the last 8760 hours. HbA1C: No results for input(s): HGBA1C in the last 72 hours. CBG: No results for input(s): GLUCAP in the last 168 hours. Lipid Profile: No results for input(s): CHOL, HDL, LDLCALC, TRIG, CHOLHDL, LDLDIRECT in the last 72 hours. Thyroid Function Tests: No results for input(s): TSH, T4TOTAL, FREET4, T3FREE, THYROIDAB in the last 72 hours. Anemia Panel: No results for input(s): VITAMINB12, FOLATE, FERRITIN, TIBC, IRON, RETICCTPCT in the last 72 hours. Urine analysis:    Component Value Date/Time   COLORURINE YELLOW 12/26/2014 2340   APPEARANCEUR CLEAR 12/26/2014 2340   LABSPEC 1.015 04/25/2015 1032   PHURINE 6.0 04/25/2015 1032   GLUCOSEU NEGATIVE 04/25/2015 1032   HGBUR TRACE (A) 04/25/2015 1032   BILIRUBINUR NEGATIVE 04/25/2015 1032   KETONESUR NEGATIVE 04/25/2015 1032   PROTEINUR NEGATIVE 04/25/2015 1032   UROBILINOGEN 0.2 04/25/2015 1032   NITRITE NEGATIVE 04/25/2015  1032   LEUKOCYTESUR MODERATE (A) 04/25/2015 1032   Sepsis Labs: @LABRCNTIP (procalcitonin:4,lacticidven:4) )No results found for this or any previous visit (from the past 240 hour(s)).   Radiological Exams on Admission: No results found.  EKG: Independently reviewed.   Assessment/Plan: Principal Problem:   Symptomatic anemia Active Problems:   Vaginal bleeding   Language barrier    This patient was discussed with the ED physician, including pertinent vitals, physical exam findings, labs, and imaging.  We also discussed care given by the ED provider.  1. Symptomatic Anemia 2. AUB 3. Language barrier a. Observation b. Discussed risks of transfusion c. CBC, Type and crossmatch, transfuse 2 units d. Recheck CBC in AM following transfusion e. Megace 80mg  BID  DVT prophylaxis: SCDs Disposition Plan: Home following transfusion   Levie Heritage, DO [facility] Pager [phone]  If 7PM-7AM, please contact night-coverage [email or web address] Password [email or web address]

## 2018-07-10 LAB — BPAM RBC
Blood Product Expiration Date: 201908202359
Blood Product Expiration Date: 201908212359
ISSUE DATE / TIME: 201908062111
ISSUE DATE / TIME: 201908070017
Unit Type and Rh: 5100
Unit Type and Rh: 5100

## 2018-07-10 LAB — TYPE AND SCREEN
ABO/RH(D): O POS
Antibody Screen: NEGATIVE
UNIT DIVISION: 0
UNIT DIVISION: 0

## 2018-08-07 ENCOUNTER — Encounter: Payer: Self-pay | Admitting: Obstetrics & Gynecology

## 2018-08-07 ENCOUNTER — Ambulatory Visit (INDEPENDENT_AMBULATORY_CARE_PROVIDER_SITE_OTHER): Payer: Self-pay | Admitting: Obstetrics & Gynecology

## 2018-08-07 VITALS — BP 125/78 | HR 84 | Wt 203.6 lb

## 2018-08-07 DIAGNOSIS — N871 Moderate cervical dysplasia: Secondary | ICD-10-CM

## 2018-08-07 DIAGNOSIS — Z3202 Encounter for pregnancy test, result negative: Secondary | ICD-10-CM

## 2018-08-07 DIAGNOSIS — N938 Other specified abnormal uterine and vaginal bleeding: Secondary | ICD-10-CM | POA: Insufficient documentation

## 2018-08-07 LAB — POCT PREGNANCY, URINE: PREG TEST UR: NEGATIVE

## 2018-08-07 NOTE — Patient Instructions (Signed)
Sangrado uterino anormal °(Abnormal Uterine Bleeding) °Sangrado uterino anormal significa que hay un sangrado por la vagina que no es su período menstrual normal. Puede ser: °· Pérdidas de sangre o hemorragias entre los períodos. °· Hemorragias luego de tener sexo (relaciones sexuales). °· Sangrado abundante o más que lo habitual. °· Períodos que duran más que lo normal. °· Sangrado luego de la menopausia. °Hay muchos problemas que pueden ser la causa. El tratamiento dependerá de la causa del sangrado. Cualquier tipo de sangrado que no sea normal debe consultarse con el médico. °CUIDADOS EN EL HOGAR °Controle su afección para ver si hay cambios. Estas indicaciones podrán disminuir cualquier molestia que tenga: °· No use tampones ni duchas vaginales o como le haya indicado el médico. °· Cambie los apósitos con frecuencia. °Deberá hacerse exámenes pélvicos regulares y pruebas de Papanicolaou. Realice los estudios indicados según le indique su médico. °SOLICITE AYUDA SI: °· El sangrado dura más de 1 semana. °· Se siente mareada por momentos. ° °SOLICITE AYUDA DE INMEDIATO SI: °· Se desmaya. °· Tiene que cambiarse los apósitos cada 15 a 30 minutos. °· Siente dolor en el abdomen. °· Tiene fiebre. °· Se siente débil o presenta sudoración. °· Elimina coágulos grandes por la vagina. °· Siente malestar estomacal (náuseas) y devuelve (vomita). ° °ASEGÚRESE DE QUE: °· Comprende estas instrucciones. °· Controlará su afección. °· Recibirá ayuda de inmediato si no mejora o si empeora. ° °Esta información no tiene como fin reemplazar el consejo del médico. Asegúrese de hacerle al médico cualquier pregunta que tenga. °Document Released: 12/22/2010 Document Revised: 11/24/2013 Document Reviewed: 06/18/2013 °Elsevier Interactive Patient Education © 2017 Elsevier Inc. ° °

## 2018-08-07 NOTE — Progress Notes (Signed)
Patient ID: Sara Schwartz, female   DOB: 1976-11-22, 42 y.o.   MRN: [id number]  Chief Complaint  Patient presents with  . Menorrhagia    HPI Sara Schwartz is a 42 y.o. female.  [id number] S/p hospitalization for anemia, DUB. She was transfused 2 units PRBC and comes for f/u today. No VB now after starting OCP prescribed by the HD, pap was done at that visit. HPI  Past Medical History:  Diagnosis Date  . Asthma   . Diabetes mellitus without complication (HCC)   . Gestational diabetes     Past Surgical History:  Procedure Laterality Date  . NO PAST SURGERIES    . VAGINAL DELIVERY     multiple    Family History  Problem Relation Age of Onset  . Diabetes Mother   . Hypertension Mother   . Diabetes Father   . Hypertension Father     Social History Social History   Tobacco Use  . Smoking status: Former Smoker    Last attempt to quit: 02/01/2015    Years since quitting: 3.5  . Smokeless tobacco: Never Used  Substance Use Topics  . Alcohol use: Yes    Comment: socially when not pregnant  . Drug use: No    No Known Allergies  Current Outpatient Medications  Medication Sig Dispense Refill  . albuterol (PROVENTIL HFA;VENTOLIN HFA) 108 (90 BASE) MCG/ACT inhaler Inhale 1-2 puffs into the lungs every 6 (six) hours as needed for wheezing or shortness of breath.    Marland Kitchen aspirin EC 81 MG tablet Take 81 mg by mouth daily.    . ferrous sulfate 325 (65 FE) MG tablet Take 1 tablet (325 mg total) by mouth 2 (two) times daily with a meal. 60 tablet 3  . PRESCRIPTION MEDICATION 1 tablet daily. States is birth control pill; but does not remember the name.    . megestrol (MEGACE) 40 MG tablet Take 1 tablet (40 mg total) by mouth 2 (two) times daily. (Patient not taking: Reported on 08/07/2018) 60 tablet 2   No current facility-administered medications for this visit.     Review of Systems Review of Systems  Constitutional: Negative.   Genitourinary: Negative for pelvic pain,  vaginal bleeding and vaginal discharge.    Blood pressure 125/78, pulse 84, weight 203 lb 9.6 oz (92.4 kg), last menstrual period 07/24/2018, unknown if currently breastfeeding.  Physical Exam Physical Exam  Constitutional: She appears well-developed. No distress.  Abdominal: She exhibits no distension.  Skin: Skin is warm and dry. No pallor.  Psychiatric: She has a normal mood and affect.  Vitals reviewed.   Data Reviewed Notes and results from hosptialization, pathology  Assessment    DUB Controlled on OCP CIN 2-3 with f/u pap done last month at [facility]    Plan    Continue OCP as prescribed by the HD RTC after financial assistance established. She may need [place], EMBx        Scheryl Darter 08/07/2018, 5:16 PM

## 2018-08-07 NOTE — Progress Notes (Signed)
States took it for 10 days and it helped bleeding.

## 2018-08-08 LAB — CBC
Hematocrit: 34.3 % (ref 34.0–46.6)
Hemoglobin: 10.3 g/dL — ABNORMAL LOW (ref 11.1–15.9)
MCH: 20.5 pg — AB (ref 26.6–33.0)
MCHC: 30 g/dL — AB (ref 31.5–35.7)
MCV: 68 fL — ABNORMAL LOW (ref 79–97)
PLATELETS: 301 10*3/uL (ref 150–450)
RBC: 5.03 x10E6/uL (ref 3.77–5.28)
WBC: 10.5 10*3/uL (ref 3.4–10.8)

## 2018-09-14 ENCOUNTER — Inpatient Hospital Stay (HOSPITAL_COMMUNITY)
Admission: AD | Admit: 2018-09-14 | Discharge: 2018-09-15 | Disposition: A | Discharge status: Home / Self Care | Payer: Self-pay | Source: Ambulatory Visit | Attending: Obstetrics and Gynecology | Admitting: Obstetrics and Gynecology

## 2018-09-14 ENCOUNTER — Other Ambulatory Visit: Payer: Self-pay

## 2018-09-14 DIAGNOSIS — N938 Other specified abnormal uterine and vaginal bleeding: Secondary | ICD-10-CM

## 2018-09-14 DIAGNOSIS — N939 Abnormal uterine and vaginal bleeding, unspecified: Secondary | ICD-10-CM | POA: Insufficient documentation

## 2018-09-14 DIAGNOSIS — Z87891 Personal history of nicotine dependence: Secondary | ICD-10-CM | POA: Insufficient documentation

## 2018-09-14 LAB — URINALYSIS, ROUTINE W REFLEX MICROSCOPIC
Bacteria, UA: NONE SEEN
Bilirubin Urine: NEGATIVE
GLUCOSE, UA: NEGATIVE mg/dL
KETONES UR: NEGATIVE mg/dL
LEUKOCYTES UA: NEGATIVE
NITRITE: NEGATIVE
PROTEIN: 100 mg/dL — AB
RBC / HPF: >50 RBC/hpf — ABNORMAL HIGH (ref 0–5)
Specific Gravity, Urine: 1.019 (ref 1.005–1.030)
pH: 5 (ref 5.0–8.0)

## 2018-09-14 LAB — POCT PREGNANCY, URINE: Preg Test, Ur: NEGATIVE

## 2018-09-14 LAB — CBC
HCT: 34.2 % — ABNORMAL LOW (ref 36.0–46.0)
Hemoglobin: 10.8 g/dL — ABNORMAL LOW (ref 12.0–15.0)
MCH: 24.3 pg — ABNORMAL LOW (ref 26.0–34.0)
MCHC: 31.6 g/dL (ref 30.0–36.0)
MCV: 77 fL — AB (ref 80.0–100.0)
NRBC: 0 % (ref 0.0–0.2)
PLATELETS: 236 10*3/uL (ref 150–400)
RBC: 4.44 MIL/uL (ref 3.87–5.11)
RDW: 21.9 % — AB (ref 11.5–15.5)
WBC: 9.5 10*3/uL (ref 4.0–10.5)

## 2018-09-14 NOTE — MAU Note (Signed)
Pt says her nexplanon was removed in august and she missed a cycle in September. This period started one week ago and became very heavy today. States she has gone through a pad every 10 min since 1600 today. Reports dizziness now.

## 2018-09-15 ENCOUNTER — Encounter (HOSPITAL_COMMUNITY): Payer: Self-pay | Admitting: *Deleted

## 2018-09-15 DIAGNOSIS — N938 Other specified abnormal uterine and vaginal bleeding: Secondary | ICD-10-CM

## 2018-09-15 MED ORDER — MEGESTROL ACETATE 40 MG PO TABS: 40.0000 mg | ORAL_TABLET | Freq: Two times a day (BID) | ORAL | 2 refills | Status: DC

## 2018-09-15 MED ORDER — MEGESTROL ACETATE 20 MG PO TABS
20.0000 mg | ORAL_TABLET | Freq: Once | ORAL | Status: AC
  Administered 2018-09-15: 20 mg via ORAL
  Filled 2018-09-15: qty 1

## 2018-09-15 NOTE — Discharge Instructions (Signed)
Sangrado uterino anormal  (Abnormal Uterine Bleeding)  El sangrado uterino anormal puede afectar a las mujeres que están en diversas etapas de la vida, desde adolescentes, mujeres fértiles y mujeres embarazadas, hasta mujeres que han llegado a la menopausia. Hay diversas clases de sangrado uterino que se consideran anormales, entre ellas:  · Pérdidas de sangre o hemorragias entre los períodos.  · Hemorragias luego de mantener relaciones sexuales.  · Sangrado abundante o más que lo habitual.  · Períodos que duran más que lo normal.  · Sangrado luego de la menopausia.  Muchos casos de sangrado uterino anormal son leves y simples de tratar, mientras que otros son más graves. El médico debe evaluar cualquier clase de sangrado anormal. El tratamiento dependerá de la causa del sangrado.  INSTRUCCIONES PARA EL CUIDADO EN EL HOGAR  Controle su afección para ver si hay cambios. Las siguientes indicaciones ayudarán a aliviar cualquier molestia que pueda sentir:  · Evite las duchas vaginales y el uso de tampones según las indicaciones del médico.  · Cámbiese las compresas con frecuencia.  Deberá hacerse exámenes pélvicos regulares y pruebas de Papanicolaou. Cumpla con todas las visitas de control y exámanes diagnósticos, según le indique su médico.  SOLICITE ATENCIÓN MÉDICA SI:  · El sangrado dura más de 1 semana.  · Se siente mareada por momentos.    SOLICITE ATENCIÓN MÉDICA DE INMEDIATO SI:  · Se desmaya.  · Debe cambiarse la compresa cada 15 a 30 minutos.  · Siente dolor abdominal.  · Tiene fiebre.  · Se siente débil o presenta sudoración.  · Elimina coágulos grandes por la vagina.  · Comienza a sentir náuseas y vomita.    ASEGÚRESE DE QUE:  · Comprende estas instrucciones.  · Controlará su afección.  · Recibirá ayuda de inmediato si no mejora o si empeora.    Esta información no tiene como fin reemplazar el consejo del médico. Asegúrese de hacerle al médico cualquier pregunta que tenga.  Document Released: 11/19/2005  Document Revised: 11/24/2013 Document Reviewed: 06/18/2013  Elsevier Interactive Patient Education © 2017 Elsevier Inc.

## 2018-09-15 NOTE — MAU Provider Note (Signed)
History     CSN: [id number]  Arrival date and time: 09/14/18 2113   First Provider Initiated Contact with Patient 09/15/18 0113      Chief Complaint  Patient presents with  . Vaginal Bleeding   HPI  Annaliyah Willig is a 42 y.o. [id number] non pregnant patient who presents to MAU with chief complaint of heavy vaginal bleeding. She denies SOB, chest pain, syncope, weakness.  Patient states she was prescribed OCP at [facility] but has not taken them for the past two weeks. Requesting short course of Megace to reduce bleeding until she can obtain OCP refill from [facility].  Pertinent Gynecological History: Menses: irregular with periods of heavy bleeding Bleeding: dysfunctional uterine bleeding Contraception: OCP (estrogen/progesterone) DES exposure: denies Blood transfusions: 2 units PRBCs 07/08/18 Sexually transmitted diseases: currently at risk Previous GYN Procedures: N/A  Last mammogram: normal Date: 02/21/17 Last pap: abnormal: LSIL, f/u colpo normal Date: 01/2017   Past Medical History:  Diagnosis Date  . Asthma   . Diabetes mellitus without complication (HCC)   . Gestational diabetes     Past Surgical History:  Procedure Laterality Date  . NO PAST SURGERIES    . VAGINAL DELIVERY     multiple    Family History  Problem Relation Age of Onset  . Diabetes Mother   . Hypertension Mother   . Diabetes Father   . Hypertension Father     Social History   Tobacco Use  . Smoking status: Former Smoker    Last attempt to quit: 02/01/2015    Years since quitting: 3.6  . Smokeless tobacco: Never Used  Substance Use Topics  . Alcohol use: Yes    Comment: socially when not pregnant  . Drug use: No    Allergies: No Known Allergies  Medications Prior to Admission  Medication Sig Dispense Refill Last Dose  . albuterol (PROVENTIL HFA;VENTOLIN HFA) 108 (90 BASE) MCG/ACT inhaler Inhale 1-2 puffs into the lungs every 6 (six) hours as needed for wheezing or shortness of  breath.   09/15/2018 at Unknown time  . aspirin EC 81 MG tablet Take 81 mg by mouth daily.   09/14/2018 at Unknown time  . ferrous sulfate 325 (65 FE) MG tablet Take 1 tablet (325 mg total) by mouth 2 (two) times daily with a meal. 60 tablet 3 09/15/2018 at Unknown time  . PRESCRIPTION MEDICATION 1 tablet daily. States is birth control pill; but does not remember the name.   09/15/2018 at Unknown time  . [DISCONTINUED] megestrol (MEGACE) 40 MG tablet Take 1 tablet (40 mg total) by mouth 2 (two) times daily. (Patient not taking: Reported on 08/07/2018) 60 tablet 2 Not Taking    Review of Systems  Constitutional: Negative for chills, fatigue and fever.  Respiratory: Negative for chest tightness and shortness of breath.   Gastrointestinal: Negative for abdominal pain, nausea and vomiting.  Genitourinary: Positive for vaginal bleeding. Negative for difficulty urinating, dyspareunia, flank pain, vaginal discharge and vaginal pain.  Musculoskeletal: Negative for back pain.  Neurological: Negative for dizziness, syncope, weakness and headaches.  All other systems reviewed and are negative.  Physical Exam   Blood pressure (!) 160/84, pulse 78, temperature 98.3 F (36.8 C), temperature source Oral, resp. rate 16, height 5\' 3"  (1.6 m), weight 95.7 kg, last menstrual period 07/08/2018, unknown if currently breastfeeding.  Physical Exam  Nursing note and vitals reviewed. Constitutional: She is oriented to person, place, and time. She appears well-developed and well-nourished.  Cardiovascular: Normal rate and intact  distal pulses.  Respiratory: Effort normal. No respiratory distress. She has no wheezes. She has no rales. She exhibits no tenderness.  GI: Soft. She exhibits no distension. There is no tenderness. There is no rebound and no guarding.  Neurological: She is alert and oriented to person, place, and time.  Skin: Skin is warm and dry.  Psychiatric: She has a normal mood and affect. Her behavior  is normal. Judgment and thought content normal.    MAU Course  Procedures  MDM --Patient declined offer of OCP refill, cannot remember name of current medication and does not want to risk switching  Patient Vitals for the past 24 hrs:  BP Temp Temp src Pulse Resp Height Weight  09/15/18 0019 (!) 160/84 - - 78 16 - -  09/14/18 2148 (!) 159/86 98.3 F (36.8 C) Oral 80 18 5\' 3"  (1.6 m) 95.7 kg    Results for orders placed or performed during the hospital encounter of 09/14/18 (from the past 24 hour(s))  Urinalysis, Routine w reflex microscopic     Status: Abnormal   Collection Time: 09/14/18  9:53 PM  Result Value Ref Range   Color, Urine AMBER (A) YELLOW   APPearance CLOUDY (A) CLEAR   Specific Gravity, Urine 1.019 1.005 - 1.030   pH 5.0 5.0 - 8.0   Glucose, UA NEGATIVE NEGATIVE mg/dL   Hgb urine dipstick LARGE (A) NEGATIVE   Bilirubin Urine NEGATIVE NEGATIVE   Ketones, ur NEGATIVE NEGATIVE mg/dL   Protein, ur 161 (A) NEGATIVE mg/dL   Nitrite NEGATIVE NEGATIVE   Leukocytes, UA NEGATIVE NEGATIVE   RBC / HPF >50 (H) 0 - 5 RBC/hpf   WBC, UA >50 (H) 0 - 5 WBC/hpf   Bacteria, UA NONE SEEN NONE SEEN  Pregnancy, urine POC     Status: None   Collection Time: 09/14/18 10:05 PM  Result Value Ref Range   Preg Test, Ur NEGATIVE NEGATIVE  CBC     Status: Abnormal   Collection Time: 09/14/18 10:25 PM  Result Value Ref Range   WBC 9.5 4.0 - 10.5 K/uL   RBC 4.44 3.87 - 5.11 MIL/uL   Hemoglobin 10.8 (L) 12.0 - 15.0 g/dL   HCT 09.6 (L) 04.5 - 40.9 %   MCV 77.0 (L) 80.0 - 100.0 fL   MCH 24.3 (L) 26.0 - 34.0 pg   MCHC 31.6 30.0 - 36.0 g/dL   RDW 81.1 (H) 91.4 - 78.2 %   Platelets 236 150 - 400 K/uL   nRBC 0.0 0.0 - 0.2 %    Assessment and Plan  --42 y.o. [id number] non-pregnant patient --Recurrence of dysfunctional uterine bleeding due to interruption in daily OCP --Discharge home in stable condition  F/U: patient to scheduled follow-up at [facility], obtain OCP refills  Calvert Cantor, CNM 09/15/2018, 1:22 AM

## 2019-11-19 ENCOUNTER — Emergency Department (HOSPITAL_COMMUNITY): Payer: Self-pay

## 2019-11-19 ENCOUNTER — Other Ambulatory Visit: Payer: Self-pay

## 2019-11-19 ENCOUNTER — Encounter (HOSPITAL_COMMUNITY): Payer: Self-pay | Admitting: Emergency Medicine

## 2019-11-19 ENCOUNTER — Emergency Department (HOSPITAL_COMMUNITY)
Admission: EM | Admit: 2019-11-19 | Discharge: 2019-11-20 | Disposition: A | Discharge status: Home / Self Care | Payer: Self-pay | Attending: Emergency Medicine | Admitting: Emergency Medicine

## 2019-11-19 DIAGNOSIS — J45909 Unspecified asthma, uncomplicated: Secondary | ICD-10-CM | POA: Insufficient documentation

## 2019-11-19 DIAGNOSIS — R0789 Other chest pain: Secondary | ICD-10-CM | POA: Insufficient documentation

## 2019-11-19 DIAGNOSIS — Z87891 Personal history of nicotine dependence: Secondary | ICD-10-CM | POA: Insufficient documentation

## 2019-11-19 DIAGNOSIS — Z7982 Long term (current) use of aspirin: Secondary | ICD-10-CM | POA: Insufficient documentation

## 2019-11-19 DIAGNOSIS — Z79899 Other long term (current) drug therapy: Secondary | ICD-10-CM | POA: Insufficient documentation

## 2019-11-19 DIAGNOSIS — E119 Type 2 diabetes mellitus without complications: Secondary | ICD-10-CM | POA: Insufficient documentation

## 2019-11-19 LAB — BASIC METABOLIC PANEL
Anion gap: 8 (ref 5–15)
BUN: 12 mg/dL (ref 6–20)
CO2: 22 mmol/L (ref 22–32)
Calcium: 8.2 mg/dL — ABNORMAL LOW (ref 8.9–10.3)
Chloride: 106 mmol/L (ref 98–111)
Creatinine, Ser: 0.74 mg/dL (ref 0.44–1.00)
GFR calc Af Amer: >60 mL/min (ref >60)
GFR calc non Af Amer: >60 mL/min (ref >60)
Glucose, Bld: 110 mg/dL — ABNORMAL HIGH (ref 70–99)
Potassium: 3.9 mmol/L (ref 3.5–5.1)
Sodium: 136 mmol/L (ref 135–145)

## 2019-11-19 LAB — CBC
HCT: 36.2 % (ref 36.0–46.0)
Hemoglobin: 11.1 g/dL — ABNORMAL LOW (ref 12.0–15.0)
MCH: 23 pg — ABNORMAL LOW (ref 26.0–34.0)
MCHC: 30.7 g/dL (ref 30.0–36.0)
MCV: 74.9 fL — ABNORMAL LOW (ref 80.0–100.0)
Platelets: 271 10*3/uL (ref 150–400)
RBC: 4.83 MIL/uL (ref 3.87–5.11)
RDW: 17.9 % — ABNORMAL HIGH (ref 11.5–15.5)
WBC: 10 10*3/uL (ref 4.0–10.5)
nRBC: 0 % (ref 0.0–0.2)

## 2019-11-19 LAB — I-STAT BETA HCG BLOOD, ED (MC, WL, AP ONLY): I-stat hCG, quantitative: <5 m[IU]/mL (ref <5)

## 2019-11-19 LAB — TROPONIN I (HIGH SENSITIVITY)
Troponin I (High Sensitivity): 4 ng/L (ref <18)
Troponin I (High Sensitivity): 3 ng/L (ref <18)

## 2019-11-19 MED ORDER — SODIUM CHLORIDE 0.9% FLUSH: 3.0000 mL | Freq: Once | INTRAVENOUS | Status: DC

## 2019-11-19 NOTE — ED Triage Notes (Signed)
Pt c/o intermittent left-sided chest pain and shortness of breath x 2 weeks. Hx asthma, denies fevers, or known covid contacts.

## 2019-11-20 LAB — D-DIMER, QUANTITATIVE (NOT AT ARMC): D-Dimer, Quant: 0.33 ug/mL-FEU (ref 0.00–0.50)

## 2019-11-20 MED ORDER — ACETAMINOPHEN 500 MG PO TABS
1000.0000 mg | ORAL_TABLET | Freq: Once | ORAL | Status: AC
  Administered 2019-11-20: 03:00:00 1000 mg via ORAL
  Filled 2019-11-20: qty 2

## 2019-11-20 NOTE — ED Provider Notes (Signed)
[facility] EMERGENCY DEPARTMENT Provider Note   CSN: [id number] Arrival date & time: 11/19/19  1716     History Chief Complaint  Patient presents with  . Chest Pain    Sara Schwartz is a 43 y.o. female with a history of asthma, gestational diabetes who presents to the emergency department with a chief complaint of chest pain.  The patient reports intermittent pleuritic chest pain for the last 2 weeks.  Her only aggravating factor is taking a deep breath.  She denies that pain is worse with exertion or eating.  She denies diaphoresis, fever, chills, shortness of breath, cough, leg swelling, palpitations, nausea, vomiting, diarrhea, abdominal pain, breast pain or discharge, or back pain.  She has not taken anything for her symptoms.  She reports she is frequently lifting and pulling things at her job.  She denies any known injury or trauma.  She reports a family history of cardiovascular disease in her mother, but is unsure of age of onset.  The history is provided by the patient. No language interpreter was used.       Past Medical History:  Diagnosis Date  . Asthma   . Diabetes mellitus without complication (HCC)   . Gestational diabetes     Patient Active Problem List   Diagnosis Date Noted  . DUB (dysfunctional uterine bleeding) 08/07/2018  . Symptomatic anemia 07/08/2018  . 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 Seasonal allergies 06/10/2015  . Language barrier 01/12/2015  . Asthma 01/10/2015  . Dysplasia of cervix, low grade (CIN 1) 01/10/2015  . Vaginal bleeding     Past Surgical History:  Procedure Laterality Date  . NO PAST SURGERIES    . VAGINAL DELIVERY     multiple     OB History    Gravida  7   Para  7   Term  4   Preterm  3   AB  0   Living  7     SAB  0   TAB  0   Ectopic  0   Multiple  0   Live Births  7           Family History  Problem Relation Age of Onset  . Diabetes Mother   .  Hypertension Mother   . Diabetes Father   . Hypertension Father     Social History   Tobacco Use  . Smoking status: Former Smoker    Quit date: 02/01/2015    Years since quitting: 4.8  . Smokeless tobacco: Never Used  Substance Use Topics  . Alcohol use: Yes    Comment: socially when not pregnant  . Drug use: No    Home Medications Prior to Admission medications   Medication Sig Start Date End Date Taking? Authorizing Provider  albuterol (PROVENTIL HFA;VENTOLIN HFA) 108 (90 BASE) MCG/ACT inhaler Inhale 1-2 puffs into the lungs every 6 (six) hours as needed for wheezing or shortness of breath.    [provider]  aspirin EC 81 MG tablet Take 81 mg by mouth daily.    [provider]  ferrous sulfate 325 (65 FE) MG tablet Take 1 tablet (325 mg total) by mouth 2 (two) times daily with a meal. 07/09/18   Adam Phenix, MD  megestrol (MEGACE) 40 MG tablet Take 1 tablet (40 mg total) by mouth 2 (two) times daily. 09/15/18   Calvert Cantor, CNM  PRESCRIPTION MEDICATION 1 tablet daily. States is birth control  pill; but does not remember the name.    [provider]    Allergies    Patient has no known allergies.  Review of Systems   Review of Systems  Constitutional: Negative for activity change, chills and fever.  Respiratory: Negative for cough, shortness of breath and wheezing.   Cardiovascular: Positive for chest pain. Negative for palpitations and leg swelling.  Gastrointestinal: Negative for abdominal pain, blood in stool, diarrhea, nausea and vomiting.  Genitourinary: Negative for dysuria.  Musculoskeletal: Negative for arthralgias, back pain, myalgias, neck pain and neck stiffness.  Skin: Negative for rash.  Allergic/Immunologic: Negative for immunocompromised state.  Neurological: Negative for dizziness, syncope, weakness, numbness and headaches.  Psychiatric/Behavioral: Negative for confusion.   Physical Exam Updated Vital Signs BP 121/82  (BP Location: Right Arm) Comment: Simultaneous filing. User may not have seen previous data.  Pulse 72   Temp 98.3 F (36.8 C) (Oral)   Resp 19   LMP 10/29/2019   SpO2 100%   Physical Exam Vitals and nursing note reviewed.  Constitutional:      General: She is not in acute distress. HENT:     Head: Normocephalic.  Eyes:     Conjunctiva/sclera: Conjunctivae normal.  Cardiovascular:     Rate and Rhythm: Normal rate and regular rhythm.     Pulses: Normal pulses.     Heart sounds: Normal heart sounds. No murmur. No friction rub. No gallop.   Pulmonary:     Effort: Pulmonary effort is normal. No respiratory distress.     Breath sounds: No stridor. No wheezing, rhonchi or rales.     Comments: No reproducible tenderness palpation to the chest wall.  No rashes.  No redness or warmth.  No tenderness palpation to the bilateral ribs or sternum. Chest:     Chest wall: No tenderness.  Abdominal:     General: There is no distension.     Palpations: Abdomen is soft. There is no mass.     Tenderness: There is no abdominal tenderness. There is no right CVA tenderness, left CVA tenderness, guarding or rebound.     Hernia: No hernia is present.  Musculoskeletal:     Cervical back: Neck supple.     Right lower leg: No edema.     Left lower leg: No edema.  Skin:    General: Skin is warm.     Findings: No rash.  Neurological:     Mental Status: She is alert.  Psychiatric:        Behavior: Behavior normal.    ED Results / Procedures / Treatments   Labs (all labs ordered are listed, but only abnormal results are displayed) Labs Reviewed  BASIC METABOLIC PANEL - Abnormal; Notable for the following components:      Result Value   Glucose, Bld 110 (*)    Calcium 8.2 (*)    All other components within normal limits  CBC - Abnormal; Notable for the following components:   Hemoglobin 11.1 (*)    MCV 74.9 (*)    MCH 23.0 (*)    RDW 17.9 (*)    All other components within normal limits    D-DIMER, QUANTITATIVE (NOT AT [facility])  I-STAT BETA HCG BLOOD, ED ([facility], [facility], [facility] ONLY)  TROPONIN I (HIGH SENSITIVITY)  TROPONIN I (HIGH SENSITIVITY)    EKG EKG Interpretation  Date/Time:  Thursday November 19 2019 17:28:12 EST Ventricular Rate:  88 PR Interval:  152 QRS Duration: 80 QT Interval:  360 QTC Calculation:  435 R Axis:   63 Text Interpretation: Normal sinus rhythm Normal ECG No previous ECGs available Confirmed by Ripley Fraise [phone]) on 11/20/2019 2:00:18 AM   Radiology DG Chest 2 View  Result Date: 11/19/2019 CLINICAL DATA:  Chest pain EXAM: CHEST - 2 VIEW COMPARISON:  None. FINDINGS: Heart and mediastinal contours are within normal limits. No focal opacities or effusions. No acute bony abnormality. IMPRESSION: No active cardiopulmonary disease. Electronically Signed   By: Rolm Baptise M.D.   On: 11/19/2019 17:58    Procedures Procedures (including critical care time)  Medications Ordered in ED Medications  acetaminophen (TYLENOL) tablet 1,000 mg (1,000 mg Oral Given 11/20/19 0245)    ED Course  I have reviewed the triage vital signs and the nursing notes.  Pertinent labs & imaging results that were available during my care of the patient were reviewed by me and considered in my medical decision making (see chart for details).    MDM Rules/Calculators/A&P                      43 year old female with a history of asthma, gestational diabetes presenting with intermittent pleuritic chest pain for the last 2 weeks.  No other associated symptoms.  No other associated symptoms.  Chest pain is not exertional and does not sound consistent with ACS.  EKG with normal sinus rhythm on my interpretation.  Delta troponin trend is flat.  Hear score is 1.  No electrolyte derangements.  Chest x-ray is unremarkable.  Given pleuritic nature pain, a D-dimer was obtained, which was normal.  Low suspicion for GERD, PE, aortic dissection, ACS, esophageal rupture.  She was given  Tylenol in the ER, and on reevaluation reports that pain is significantly improved.  She does work in a job where she is frequently pulling and lifting on things, but she did not have reproducible chest wall pain, but given her work-up, and it is reasonable to discharge the patient home with outpatient follow-up and continue to take Tylenol for pain control.  All questions answered.  A Spanish medical interpreter was used.  She is safe for discharge home with outpatient follow-up is indicated at this time.  Final Clinical Impression(s) / ED Diagnoses Final diagnoses:  Atypical chest pain    Rx / DC Orders ED Discharge Orders    None       Joanne Gavel, PA-C 11/20/19 0747    Ripley Fraise, MD 11/24/19 [phone]

## 2019-11-20 NOTE — ED Notes (Signed)
Patient verbalizes understanding of discharge instructions. Opportunity for questioning and answers were provided. Armband removed by staff, pt discharged from ED. Ambulated out to lobby  

## 2019-11-20 NOTE — Discharge Instructions (Signed)
Gracias por permitirme atenderlo [employer] [facility].  Puede tomar 1000 mg de Tylenol una vez cada 8 horas para Conservation officer, historic buildings. No tome ms de 4000 mg en un perodo de 24 horas. Puede aplicar una compresa de hielo o una compresa trmica en las reas adoloridas durante 15 a 20 minutos con la frecuencia que necesite. Intenta realizar  Seguimiento con atencin primaria si el dolor persiste a Arboriculturist de Forensic psychologist.  Regrese a la sala de emergencias si presenta dolor en el pecho que se vuelve constante, peor con el esfuerzo, falta de aire severa, sudoracin, si se desmaya, si sus dedos o labios se ponen azules, u otros sntomas nuevos relacionados.  Thank you for allowing me to care for you today in the Emergency Department.   You can take 1000 mg of Tylenol once every 8 hours for pain.  Do not take more than 4000 mg in a 24-hour period.  You can apply ice pack or heat pack to areas that are sore for 15 to 20 minutes as frequently as needed.  Try to perform  Follow-up with primary care if the pain persist despite following this treatment plan.  Return to the emergency department if you develop chest pain that becomes constant, worse with exertion, severe shortness of breath, sweating, if you pass out, if your fingers or lips turn blue, or other new, concerning symptoms.

## 2019-12-14 ENCOUNTER — Ambulatory Visit
Admission: EM | Admit: 2019-12-14 | Discharge: 2019-12-14 | Disposition: A | Discharge status: Home / Self Care | Payer: Self-pay | Attending: Emergency Medicine | Admitting: Emergency Medicine

## 2019-12-14 ENCOUNTER — Encounter: Payer: Self-pay | Admitting: Emergency Medicine

## 2019-12-14 ENCOUNTER — Other Ambulatory Visit: Payer: Self-pay

## 2019-12-14 DIAGNOSIS — J069 Acute upper respiratory infection, unspecified: Secondary | ICD-10-CM

## 2019-12-14 DIAGNOSIS — R05 Cough: Secondary | ICD-10-CM

## 2019-12-14 DIAGNOSIS — R059 Cough, unspecified: Secondary | ICD-10-CM

## 2019-12-14 DIAGNOSIS — Z20822 Contact with and (suspected) exposure to covid-19: Secondary | ICD-10-CM

## 2019-12-14 MED ORDER — AEROCHAMBER PLUS FLO-VU MEDIUM MISC: 1.0000 | Freq: Once | 0 refills | Status: AC

## 2019-12-14 MED ORDER — ALBUTEROL SULFATE HFA 108 (90 BASE) MCG/ACT IN AERS: 1.0000 | INHALATION_SPRAY | Freq: Four times a day (QID) | RESPIRATORY_TRACT | 0 refills | Status: DC | PRN

## 2019-12-14 NOTE — ED Provider Notes (Signed)
[name]    CSN: [id number] Arrival date & time: 12/14/19  1950      History   Chief Complaint Chief Complaint  Patient presents with  . URI    HPI Sara Schwartz is a 44 y.o. female with history of diabetes, asthma    Presenting for Covid testing: Exposure: Coworker who tested positive Friday Date of exposure: Tuesday Any fever, symptoms since exposure: Yes-2-day course of chills, cough, nasal congestion, headache.  Has tried ibuprofen without significant relief.  Not currently taking allergy medications.  Patient did use of albuterol inhaler, the states she is out: Requesting refill.   Past Medical History:  Diagnosis Date  . Asthma   . Diabetes mellitus without complication (HCC)   . Gestational diabetes     Patient Active Problem List   Diagnosis Date Noted  . DUB (dysfunctional uterine bleeding) 08/07/2018  . Symptomatic anemia 07/08/2018  . 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 Seasonal allergies 06/10/2015  . Language barrier 01/12/2015  . Asthma 01/10/2015  . Dysplasia of cervix, low grade (CIN 1) 01/10/2015  . Vaginal bleeding     Past Surgical History:  Procedure Laterality Date  . NO PAST SURGERIES    . VAGINAL DELIVERY     multiple    OB History    Gravida  7   Para  7   Term  4   Preterm  3   AB  0   Living  7     SAB  0   TAB  0   Ectopic  0   Multiple  0   Live Births  7            Home Medications    Prior to Admission medications   Medication Sig Start Date End Date Taking? Authorizing Provider  albuterol (VENTOLIN HFA) 108 (90 Base) MCG/ACT inhaler Inhale 1-2 puffs into the lungs every 6 (six) hours as needed for wheezing or shortness of breath. 12/14/19   Hall-Potvin, [place], PA-C  aspirin EC 81 MG tablet Take 81 mg by mouth daily.    [provider]  ferrous sulfate 325 (65 FE) MG tablet Take 1 tablet (325 mg total) by mouth 2 (two) times daily with a meal. 07/09/18    Adam Phenix, MD  megestrol (MEGACE) 40 MG tablet Take 1 tablet (40 mg total) by mouth 2 (two) times daily. 09/15/18   Calvert Cantor, CNM  PRESCRIPTION MEDICATION 1 tablet daily. States is birth control pill; but does not remember the name.    [provider]  Spacer/Aero-Holding Chambers (AEROCHAMBER PLUS FLO-VU MEDIUM) MISC 1 each by Other route once for 1 dose. 12/14/19 12/14/19  Hall-Potvin, [place], PA-C    Family History Family History  Problem Relation Age of Onset  . Diabetes Mother   . Hypertension Mother   . Diabetes Father   . Hypertension Father     Social History Social History   Tobacco Use  . Smoking status: Former Smoker    Quit date: 02/01/2015    Years since quitting: 4.8  . Smokeless tobacco: Never Used  Substance Use Topics  . Alcohol use: Yes    Comment: socially when not pregnant  . Drug use: No     Allergies   Patient has no known allergies.   Review of Systems Review of Systems  Constitutional: Positive for chills. Negative for activity change, appetite change, fatigue and fever.  HENT: Positive for congestion. Negative  for dental problem, ear pain, facial swelling, hearing loss, sinus pain, sore throat, trouble swallowing and voice change.   Eyes: Negative for photophobia, pain and visual disturbance.  Respiratory: Positive for cough. Negative for shortness of breath, wheezing and stridor.   Cardiovascular: Negative for chest pain, palpitations and leg swelling.  Gastrointestinal: Negative for abdominal pain, diarrhea, nausea and vomiting.  Musculoskeletal: Negative for arthralgias and myalgias.  Neurological: Positive for headaches. Negative for dizziness, facial asymmetry, weakness and light-headedness.     Physical Exam Triage Vital Signs ED Triage Vitals  Enc Vitals Group     BP      Pulse      Resp      Temp      Temp src      SpO2      Weight      Height      Head Circumference      Peak Flow      Pain Score       Pain Loc      Pain Edu?      Excl. in GC?    No data found.  Updated Vital Signs BP (!) 161/104 (BP Location: Left Arm)   Pulse 93   Temp 98.3 F (36.8 C) (Temporal)   Resp 16   LMP 11/18/2019   SpO2 99%   Visual Acuity Right Eye Distance:   Left Eye Distance:   Bilateral Distance:    Right Eye Near:   Left Eye Near:    Bilateral Near:     Physical Exam Constitutional:      General: She is not in acute distress.    Appearance: She is obese. She is not toxic-appearing or diaphoretic.  HENT:     Head: Normocephalic and atraumatic.     Mouth/Throat:     Mouth: Mucous membranes are moist.     Pharynx: Oropharynx is clear.  Eyes:     General: No scleral icterus.    Conjunctiva/sclera: Conjunctivae normal.     Pupils: Pupils are equal, round, and reactive to light.  Neck:     Comments: Trachea midline, negative JVD Cardiovascular:     Rate and Rhythm: Normal rate and regular rhythm.     Heart sounds: No murmur. No gallop.   Pulmonary:     Effort: Pulmonary effort is normal. No respiratory distress.     Breath sounds: No wheezing, rhonchi or rales.     Comments: Decreased airflow bilaterally Musculoskeletal:        General: Normal range of motion.     Cervical back: Neck supple. No tenderness.     Right lower leg: No edema.     Left lower leg: No edema.  Lymphadenopathy:     Cervical: No cervical adenopathy.  Skin:    Capillary Refill: Capillary refill takes less than 2 seconds.     Coloration: Skin is not jaundiced or pale.     Findings: No rash.  Neurological:     General: No focal deficit present.     Mental Status: She is alert and oriented to person, place, and time.      UC Treatments / Results  Labs (all labs ordered are listed, but only abnormal results are displayed) Labs Reviewed  NOVEL CORONAVIRUS, NAA    EKG   Radiology No results found.  Procedures Procedures (including critical care time)  Medications Ordered in  UC Medications - No data to display  Initial Impression / Assessment and Plan / UC  Course  I have reviewed the triage vital signs and the nursing notes.  Pertinent labs & imaging results that were available during my care of the patient were reviewed by me and considered in my medical decision making (see chart for details).     Patient afebrile, nontoxic, with SpO2 99%.  Covid PCR pending.  Patient to quarantine until results are back.  We will continue supportive management, refill for albuterol sent, will add spacing device for better administration.  Patient blood pressure elevated: Denied headache, chest pain, shortness of breath, severe abdominal pain office today-we will follow-up with PCP for further surveillance/management.  Return precautions discussed, patient verbalized understanding and is agreeable to plan. Final Clinical Impressions(s) / UC Diagnoses   Final diagnoses:  Viral URI with cough  Cough     Discharge Instructions     Your COVID test is pending - it is important to quarantine / isolate at home until your results are back. If you test positive and would like further evaluation for persistent or worsening symptoms, you may schedule an E-visit or virtual (video) visit throughout the [facility] app or website.  PLEASE NOTE: If you develop severe chest pain or shortness of breath please go to the ER or call 9-1-1 for further evaluation --> DO NOT schedule electronic or virtual visits for this. Please call our office for further guidance / recommendations as needed.    ED Prescriptions    Medication Sig Dispense Auth. Provider   albuterol (VENTOLIN HFA) 108 (90 Base) MCG/ACT inhaler Inhale 1-2 puffs into the lungs every 6 (six) hours as needed for wheezing or shortness of breath. 18 g Hall-Potvin, [place], PA-C   Spacer/Aero-Holding Chambers (AEROCHAMBER PLUS FLO-VU MEDIUM) MISC 1 each by Other route once for 1 dose. 1 each Hall-Potvin, [place], PA-C      PDMP not reviewed this encounter.   Hall-Potvin, [place], [place] 12/14/19 2012

## 2019-12-14 NOTE — ED Triage Notes (Signed)
Pt presents to [facility] for assessment of chills, cough, nasal congestion, headache x 2 days.

## 2019-12-14 NOTE — Discharge Instructions (Signed)
Your COVID test is pending - it is important to quarantine / isolate at home until your results are back. °If you test positive and would like further evaluation for persistent or worsening symptoms, you may schedule an E-visit or virtual (video) visit throughout the [facility] My[facility] app or website. ° °PLEASE NOTE: If you develop severe chest pain or shortness of breath please go to the ER or call [phone] for further evaluation --> DO NOT schedule electronic or virtual visits for this. °Please call our office for further guidance / recommendations as needed. °

## 2019-12-14 NOTE — ED Notes (Signed)
Patient able to ambulate independently  

## 2019-12-16 LAB — NOVEL CORONAVIRUS, NAA: SARS-CoV-2, NAA: DETECTED — AB

## 2019-12-17 ENCOUNTER — Encounter (HOSPITAL_COMMUNITY): Payer: Self-pay

## 2019-12-17 ENCOUNTER — Telehealth (HOSPITAL_COMMUNITY): Payer: Self-pay | Admitting: Emergency Medicine

## 2019-12-17 NOTE — Telephone Encounter (Signed)

## 2019-12-18 ENCOUNTER — Telehealth (HOSPITAL_COMMUNITY): Payer: Self-pay | Admitting: Emergency Medicine

## 2019-12-18 NOTE — Telephone Encounter (Signed)
Pt called asking questions regarding covid. With spanish interpreter, all questions answered.

## 2020-01-21 ENCOUNTER — Emergency Department (HOSPITAL_COMMUNITY): Payer: Self-pay

## 2020-01-21 ENCOUNTER — Other Ambulatory Visit: Payer: Self-pay

## 2020-01-21 ENCOUNTER — Emergency Department (HOSPITAL_COMMUNITY)
Admission: EM | Admit: 2020-01-21 | Discharge: 2020-01-22 | Disposition: A | Discharge status: Home / Self Care | Payer: Self-pay | Attending: Emergency Medicine | Admitting: Emergency Medicine

## 2020-01-21 DIAGNOSIS — R079 Chest pain, unspecified: Secondary | ICD-10-CM | POA: Insufficient documentation

## 2020-01-21 DIAGNOSIS — J45909 Unspecified asthma, uncomplicated: Secondary | ICD-10-CM | POA: Insufficient documentation

## 2020-01-21 DIAGNOSIS — E119 Type 2 diabetes mellitus without complications: Secondary | ICD-10-CM | POA: Insufficient documentation

## 2020-01-21 DIAGNOSIS — Z7982 Long term (current) use of aspirin: Secondary | ICD-10-CM | POA: Insufficient documentation

## 2020-01-21 DIAGNOSIS — Z87891 Personal history of nicotine dependence: Secondary | ICD-10-CM | POA: Insufficient documentation

## 2020-01-21 LAB — CBC
HCT: 37.1 % (ref 36.0–46.0)
Hemoglobin: 11.2 g/dL — ABNORMAL LOW (ref 12.0–15.0)
MCH: 22 pg — ABNORMAL LOW (ref 26.0–34.0)
MCHC: 30.2 g/dL (ref 30.0–36.0)
MCV: 72.9 fL — ABNORMAL LOW (ref 80.0–100.0)
Platelets: 305 10*3/uL (ref 150–400)
RBC: 5.09 MIL/uL (ref 3.87–5.11)
RDW: 16.7 % — ABNORMAL HIGH (ref 11.5–15.5)
WBC: 10.3 10*3/uL (ref 4.0–10.5)
nRBC: 0 % (ref 0.0–0.2)

## 2020-01-21 LAB — D-DIMER, QUANTITATIVE: D-Dimer, Quant: 0.27 ug/mL-FEU (ref 0.00–0.50)

## 2020-01-21 LAB — TROPONIN I (HIGH SENSITIVITY)
Troponin I (High Sensitivity): 2 ng/L (ref <18)
Troponin I (High Sensitivity): 3 ng/L (ref <18)

## 2020-01-21 LAB — BASIC METABOLIC PANEL
Anion gap: 9 (ref 5–15)
BUN: 9 mg/dL (ref 6–20)
CO2: 21 mmol/L — ABNORMAL LOW (ref 22–32)
Calcium: 8.9 mg/dL (ref 8.9–10.3)
Chloride: 106 mmol/L (ref 98–111)
Creatinine, Ser: 0.66 mg/dL (ref 0.44–1.00)
GFR calc Af Amer: >60 mL/min (ref >60)
GFR calc non Af Amer: >60 mL/min (ref >60)
Glucose, Bld: 159 mg/dL — ABNORMAL HIGH (ref 70–99)
Potassium: 3.7 mmol/L (ref 3.5–5.1)
Sodium: 136 mmol/L (ref 135–145)

## 2020-01-21 LAB — I-STAT BETA HCG BLOOD, ED (MC, WL, AP ONLY): I-stat hCG, quantitative: <5 m[IU]/mL (ref <5)

## 2020-01-21 MED ORDER — SODIUM CHLORIDE 0.9% FLUSH: 3.0000 mL | Freq: Once | INTRAVENOUS | Status: DC

## 2020-01-21 MED ORDER — SODIUM CHLORIDE 0.9 % IV BOLUS
1000.0000 mL | Freq: Once | INTRAVENOUS | Status: AC
  Administered 2020-01-21: 1000 mL via INTRAVENOUS

## 2020-01-21 NOTE — ED Triage Notes (Signed)
Dizziness, blurry vision, heart racing and shortness of breath that started about 2 hours ago. Pt denies pain. COIVD + 1/11

## 2020-01-21 NOTE — Discharge Instructions (Addendum)
You have been seen today for chest pain. Please read and follow all provided instructions. Return to the emergency room for worsening condition or new concerning symptoms.    Your work up here was all normal. Your EKG does not show a heart attack.  1. Medications:  Continue usual home medications Take medications as prescribed. Please review all of the medicines and only take them if you do not have an allergy to them.   2. Treatment: rest, drink plenty of fluids  3. Follow Up:  Please follow up with primary care provider by scheduling an appointment as soon as possible for a visit  If you do not have a primary care physician, contact [facility] at [phone] for referral   It is also a possibility that you have an allergic reaction to any of the medicines that you have been prescribed - Everybody reacts differently to medications and while MOST people have no trouble with most medicines, you may have a reaction such as nausea, vomiting, rash, swelling, shortness of breath. If this is the case, please stop taking the medicine immediately and contact your physician.  ?

## 2020-01-21 NOTE — ED Provider Notes (Signed)
[facility] Provider Note   CSN: [id number] Arrival date & time: 01/21/20  1935     History Chief Complaint  Patient presents with  . Dizziness    Sara Schwartz is a 44 y.o. female past medical history significant for asthma presents to emergency department today with chief complaint of acute onset of dizziness.  Patient states earlier this evening she was sitting on her couch watching TV.  She had sudden onset of pleuritic chest pain, generalized weakness and numbness.  She states her whole body felt numb.  She felt dizzy as if the room was spinning.  She states the symptoms lasted approximately 45 minutes and have all resolved except the pleuritic chest pain.  She rates the pain 4 out of 10 in severity.  She did not take any medications for symptoms prior to arrival. She states her mother has hypertension and diabetes otherwise no family history of cardiac disease.  No family history of sudden cardiac death. Denies fever, chills, headaches, visual changes, palpitations, syncope, dyspnea on exertion, SOB, chest tightness or pressure, radiation to left/right arm, jaw or back, nausea, or diaphoresis.  Due to language barrier, a video interpreter was present during the history-taking and subsequent discussion (and for part of the physical exam) with this patient.    Past Medical History:  Diagnosis Date  . Asthma   . Diabetes mellitus without complication ([place])   . Gestational diabetes     Patient Active Problem List   Diagnosis Date Noted  . DUB (dysfunctional uterine bleeding) 08/07/2018  . Symptomatic anemia 07/08/2018  . 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 Seasonal allergies 06/10/2015  . Language barrier 01/12/2015  . Asthma 01/10/2015  . Dysplasia of cervix, low grade (CIN 1) 01/10/2015  . Vaginal bleeding     Past Surgical History:  Procedure Laterality Date  . NO PAST SURGERIES    . VAGINAL DELIVERY     multiple     OB History    Gravida  7   Para  7   Term  4   Preterm  3   AB  0   Living  7     SAB  0   TAB  0   Ectopic  0   Multiple  0   Live Births  7           Family History  Problem Relation Age of Onset  . Diabetes Mother   . Hypertension Mother   . Diabetes Father   . Hypertension Father     Social History   Tobacco Use  . Smoking status: Former Smoker    Quit date: 02/01/2015    Years since quitting: 4.9  . Smokeless tobacco: Never Used  Substance Use Topics  . Alcohol use: Yes    Comment: socially when not pregnant  . Drug use: No    Home Medications Prior to Admission medications   Medication Sig Start Date End Date Taking? Authorizing Provider  albuterol (VENTOLIN HFA) 108 (90 Base) MCG/ACT inhaler Inhale 1-2 puffs into the lungs every 6 (six) hours as needed for wheezing or shortness of breath. 12/14/19  Yes Hall-Potvin, [place], PA-C  aspirin EC 81 MG tablet Take 81 mg by mouth daily.   Yes [provider]  ferrous sulfate 325 (65 FE) MG tablet Take 1 tablet (325 mg total) by mouth 2 (two) times daily with a meal. Patient not taking: Reported on 01/21/2020 07/09/18  Adam Phenix, MD  megestrol (MEGACE) 40 MG tablet Take 1 tablet (40 mg total) by mouth 2 (two) times daily. Patient not taking: Reported on 01/21/2020 09/15/18   Calvert Cantor, CNM    Allergies    Patient has no known allergies.  Review of Systems   Review of Systems  All other systems are reviewed and are negative for acute change except as noted in the HPI.   Physical Exam Updated Vital Signs BP (!) 129/95 (BP Location: Left Arm)   Pulse (!) 118   Temp 98.3 F (36.8 C) (Oral)   Resp 20   Wt 95.3 kg   LMP 01/07/2020   SpO2 100%   BMI 37.20 kg/m   Physical Exam Vitals and nursing note reviewed.  Constitutional:      General: She is not in acute distress.    Appearance: She is not ill-appearing.  HENT:     Head: Normocephalic and  atraumatic.     Comments: No sinus or temporal tenderness.    Right Ear: Tympanic membrane and external ear normal.     Left Ear: Tympanic membrane and external ear normal.     Nose: Nose normal.     Mouth/Throat:     Mouth: Mucous membranes are moist.     Pharynx: Oropharynx is clear.  Eyes:     General: No scleral icterus.       Right eye: No discharge.        Left eye: No discharge.     Extraocular Movements: Extraocular movements intact.     Conjunctiva/sclera: Conjunctivae normal.     Pupils: Pupils are equal, round, and reactive to light.  Neck:     Vascular: No JVD.  Cardiovascular:     Rate and Rhythm: Regular rhythm. Tachycardia present.     Pulses: Normal pulses.          Radial pulses are 2+ on the right side and 2+ on the left side.     Heart sounds: Normal heart sounds.  Pulmonary:     Comments: Lungs clear to auscultation in all fields. Symmetric chest rise. No wheezing, rales, or rhonchi. Chest:     Chest wall: No tenderness.  Abdominal:     Comments: Abdomen is soft, non-distended, and non-tender in all quadrants. No rigidity, no guarding. No peritoneal signs.  Musculoskeletal:        General: Normal range of motion.     Cervical back: Normal range of motion.  Skin:    General: Skin is warm and dry.     Capillary Refill: Capillary refill takes less than 2 seconds.  Neurological:     Mental Status: She is oriented to person, place, and time.     GCS: GCS eye subscore is 4. GCS verbal subscore is 5. GCS motor subscore is 6.     Comments: Fluent speech, no facial droop.  Speech is clear and goal oriented, follows commands CN III-XII intact, no facial droop Normal strength in upper and lower extremities bilaterally including dorsiflexion and plantar flexion, strong and equal grip strength Sensation normal to light and sharp touch Moves extremities without ataxia, coordination intact Normal finger to nose and rapid alternating movements Normal gait and  balance    Psychiatric:        Behavior: Behavior normal.       ED Results / Procedures / Treatments   Labs (all labs ordered are listed, but only abnormal results are displayed) Labs Reviewed  BASIC METABOLIC  PANEL - Abnormal; Notable for the following components:      Result Value   CO2 21 (*)    Glucose, Bld 159 (*)    All other components within normal limits  CBC - Abnormal; Notable for the following components:   Hemoglobin 11.2 (*)    MCV 72.9 (*)    MCH 22.0 (*)    RDW 16.7 (*)    All other components within normal limits  D-DIMER, QUANTITATIVE (NOT AT [facility])  I-STAT BETA HCG BLOOD, ED ([facility], [facility], [facility] ONLY)  TROPONIN I (HIGH SENSITIVITY)  TROPONIN I (HIGH SENSITIVITY)    EKG EKG Interpretation  Date/Time:  Thursday January 21 2020 19:39:52 EST Ventricular Rate:  117 PR Interval:  174 QRS Duration: 80 QT Interval:  328 QTC Calculation: 457 R Axis:   28 Text Interpretation: Sinus tachycardia Otherwise normal ECG No STEMI Confirmed by Alvester Chou [phone]) on 01/21/2020 10:17:01 PM    EKG Interpretation  Date/Time:  Thursday January 21 2020 22:12:36 EST Ventricular Rate:  101 PR Interval:  174 QRS Duration: 87 QT Interval:  345 QTC Calculation: 448 R Axis:   58 Text Interpretation: Sinus tachycardia Low voltage, precordial leads No STEMI Confirmed by Alvester Chou [phone]) on 01/21/2020 10:18:23 PM         Radiology DG Chest 2 View  Result Date: 01/21/2020 CLINICAL DATA:  44 year old female with shortness of breath, chest pain, dizziness and lightheaded. Former smoker. EXAM: CHEST - 2 VIEW COMPARISON:  Chest radiographs 11/19/2019. FINDINGS: Lung volumes and mediastinal contours remain normal. Visualized tracheal air column is within normal limits. No pneumothorax, pulmonary edema, pleural effusion or confluent pulmonary opacity. Diffuse increased interstitial markings appears stable. No acute osseous abnormality identified. Negative visible bowel gas  pattern. IMPRESSION: No acute cardiopulmonary abnormality. Stable probably smoking related increased pulmonary interstitium. Electronically Signed   By: Odessa Fleming M.D.   On: 01/21/2020 20:11    Procedures Procedures (including critical care time)  Medications Ordered in ED Medications  sodium chloride flush (NS) 0.9 % injection 3 mL (has no administration in time range)    ED Course  I have reviewed the triage vital signs and the nursing notes.  Pertinent labs & imaging results that were available during my care of the patient were reviewed by me and considered in my medical decision making (see chart for details).  Clinical Course as of Jan 20 2243  Thu Jan 21, 2020  2218 I discussed the initial ecg with Dr. Jacinto Halim of cardiology regarding concern for possible brugada type pattern in V1-V2, but Dr Jacinto Halim noted that the baseline was inconsistent and several beats did not exhibit this pattern.  He recommended we repeat an ECG, which we did, which showed a smoother baseline and no further brugada-type pattern.  I suspect therefore this was artifact finding.  She has no personal hx of syncope or family hx of sudden death either.  I suspect this is less likely a channelopathy   [MT]    Clinical Course User Index [MT] Trifan, Kermit Balo, MD   MDM Rules/Calculators/A&P                      Patient seen and examined. Patient presents awake, alert, hemodynamically stable, afebrile, non toxic. She was noted to be tachycardic to 118 in  Triage. During my exam heart rate 115.  Lungs are clear to auscultation all fields.  Chest is nontender.  Abdomen is nontender.  Neuro exam is normal. EKG  performed in triage shows tachycardia, there was concern for Brugada type pattern in V1 and V2.  ED attending discussed this with on-call cardiologist Dr. Jacinto Halim.  Please see ED course above with more details of the conversation.  Repeat EKG shows slower rate and does not have Brugada pattern, it was likely an artifact  finding. IVF given. Heart score of 1. Delta troponin flat.  CBC without leukocytosis, hemoglobin of 11.2 appears consistent with her baseline.  BMP without severe electrolyte derangement, no renal insufficiency.  Does have glucose of 159.  D-dimer is negative, PE unlikely cause of pleuritic chest pain. I viewed pt's chest xray and it does not suggest acute infectious processes. On reassessment patient is asymptomatic.  Tachycardia has resolved.  Given reassuring workup here feel that she is stable to be discharged home with close pcp follow up.  The patient appears reasonably screened and/or stabilized for discharge and I doubt any other medical condition or other [facility] requiring further screening, evaluation, or treatment in the ED at this time prior to discharge. The patient is safe for discharge with strict return precautions discussed. Findings and plan of care discussed with supervising physician Dr. Renaye Rakers.   Portions of this note were generated with Scientist, clinical (histocompatibility and immunogenetics). Dictation errors may occur despite best attempts at proofreading.    Final Clinical Impression(s) / ED Diagnoses Final diagnoses:  Chest pain, unspecified type    Rx / DC Orders ED Discharge Orders    None       Kathyrn Lass 01/21/20 2358    Terald Sleeper, MD 01/22/20 [phone]

## 2020-03-30 ENCOUNTER — Encounter: Payer: Self-pay | Admitting: Emergency Medicine

## 2020-03-30 ENCOUNTER — Other Ambulatory Visit: Payer: Self-pay

## 2020-03-30 ENCOUNTER — Ambulatory Visit
Admission: EM | Admit: 2020-03-30 | Discharge: 2020-03-30 | Disposition: A | Discharge status: Home / Self Care | Payer: Self-pay | Attending: Emergency Medicine | Admitting: Emergency Medicine

## 2020-03-30 DIAGNOSIS — J069 Acute upper respiratory infection, unspecified: Secondary | ICD-10-CM

## 2020-03-30 MED ORDER — FLUTICASONE PROPIONATE 50 MCG/ACT NA SUSP: 1.0000 | Freq: Every day | NASAL | 0 refills | Status: DC

## 2020-03-30 MED ORDER — BENZONATATE 100 MG PO CAPS: 100.0000 mg | ORAL_CAPSULE | Freq: Three times a day (TID) | ORAL | 0 refills | Status: DC

## 2020-03-30 MED ORDER — PREDNISONE 20 MG PO TABS: 40.0000 mg | ORAL_TABLET | Freq: Every day | ORAL | 0 refills | Status: AC

## 2020-03-30 MED ORDER — CETIRIZINE HCL 10 MG PO TABS: 10.0000 mg | ORAL_TABLET | Freq: Every day | ORAL | 0 refills | Status: DC

## 2020-03-30 NOTE — ED Notes (Signed)
Obtained a nasal swab for send out, labeled

## 2020-03-30 NOTE — ED Triage Notes (Signed)
03/28/2020 is onset of symptoms.  Patient has a cough, lung pain, wheezing.  Then started having headache, sore throat and dizziness

## 2020-03-30 NOTE — Discharge Instructions (Addendum)

## 2020-03-30 NOTE — ED Provider Notes (Signed)
[name]    CSN: [id number] Arrival date & time: 03/30/20  3500      History   Chief Complaint Chief Complaint  Patient presents with  . Cough    HPI Sara Schwartz is a 44 y.o. female 3 diabetes, asthma presenting for URI symptoms since Monday.  History provided by patient using video [employer] interpreter services.  Endorsing dry cough, wheezing, lung pain, headaches, sore throat.  Denies worst headache of life, thunderclap headache, change in vision or hearing.  Some sinus congestion, though no significant pressure.  Has been taking ibuprofen for headaches, her albuterol inhaler 4 times daily for asthma, nothing for sore throat.  States interventions currently are helping.  States her son was recently ill, though tested negative for Covid.  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  Denied fever, chest pain, palpitations, lower extremity edema, abdominal pain, vomiting, diarrhea.   Past Medical History:  Diagnosis Date  . Asthma   . Diabetes mellitus without complication ([place])   . Gestational diabetes     Patient Active Problem List   Diagnosis Date Noted  . DUB (dysfunctional uterine bleeding) 08/07/2018  . Symptomatic anemia 07/08/2018  . 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 Seasonal allergies 06/10/2015  . Language barrier 01/12/2015  . Asthma 01/10/2015  . Dysplasia of cervix, low grade (CIN 1) 01/10/2015  . Vaginal bleeding     Past Surgical History:  Procedure Laterality Date  . NO PAST SURGERIES    . VAGINAL DELIVERY     multiple    OB History    Gravida  7   Para  7   Term  4   Preterm  3   AB  0   Living  7     SAB  0   TAB  0   Ectopic  0   Multiple  0   Live Births  7            Home Medications    Prior to Admission medications   Medication Sig Start Date End Date Taking? Authorizing Provider  albuterol (VENTOLIN HFA) 108 (90 Base) MCG/ACT inhaler Inhale 1-2  puffs into the lungs every 6 (six) hours as needed for wheezing or shortness of breath. 12/14/19  Yes Hall-Potvin, [place], PA-C  aspirin EC 81 MG tablet Take 81 mg by mouth daily.    [provider]  benzonatate (TESSALON) 100 MG capsule Take 1 capsule (100 mg total) by mouth every 8 (eight) hours. 03/30/20   Hall-Potvin, [place], PA-C  cetirizine (ZYRTEC ALLERGY) 10 MG tablet Take 1 tablet (10 mg total) by mouth daily. 03/30/20   Hall-Potvin, [place], PA-C  ferrous sulfate 325 (65 FE) MG tablet Take 1 tablet (325 mg total) by mouth 2 (two) times daily with a meal. Patient not taking: Reported on 01/21/2020 07/09/18   Woodroe Mode, MD  fluticasone [facility]) 50 MCG/ACT nasal spray Place 1 spray into both nostrils daily. 03/30/20   Hall-Potvin, [place], PA-C  megestrol (MEGACE) 40 MG tablet Take 1 tablet (40 mg total) by mouth 2 (two) times daily. Patient not taking: Reported on 01/21/2020 09/15/18   Darlina Rumpf, CNM  predniSONE (DELTASONE) 20 MG tablet Take 2 tablets (40 mg total) by mouth daily for 5 days. 03/30/20 04/04/20  Hall-Potvin, [place], PA-C    Family History Family History  Problem Relation Age of Onset  . Diabetes Mother   . Hypertension Mother   .  Diabetes Father   . Hypertension Father     Social History Social History   Tobacco Use  . Smoking status: Former Smoker    Quit date: 02/01/2015    Years since quitting: 5.1  . Smokeless tobacco: Never Used  Substance Use Topics  . Alcohol use: Yes    Comment: socially when not pregnant  . Drug use: No     Allergies   Patient has no known allergies.   Review of Systems As per HPI   Physical Exam Triage Vital Signs ED Triage Vitals  Enc Vitals Group     BP      Pulse      Resp      Temp      Temp src      SpO2      Weight      Height      Head Circumference      Peak Flow      Pain Score      Pain Loc      Pain Edu?      Excl. in GC?    No data found.  Updated Vital Signs BP 125/72  (BP Location: Left Arm)   Pulse 87   Temp 99.7 F (37.6 C) (Oral)   Resp 20   LMP 03/09/2020   SpO2 98%   Visual Acuity Right Eye Distance:   Left Eye Distance:   Bilateral Distance:    Right Eye Near:   Left Eye Near:    Bilateral Near:     Physical Exam Constitutional:      General: She is not in acute distress.    Appearance: She is obese. She is not ill-appearing or diaphoretic.  HENT:     Head: Normocephalic and atraumatic.     Right Ear: Tympanic membrane, ear canal and external ear normal.     Left Ear: Tympanic membrane, ear canal and external ear normal.     Nose: Nose normal.     Mouth/Throat:     Mouth: Mucous membranes are moist.     Pharynx: Oropharynx is clear. No oropharyngeal exudate or posterior oropharyngeal erythema.  Eyes:     General: No scleral icterus.    Conjunctiva/sclera: Conjunctivae normal.     Pupils: Pupils are equal, round, and reactive to light.  Neck:     Comments: Trachea midline, negative JVD Cardiovascular:     Rate and Rhythm: Normal rate and regular rhythm.     Heart sounds: No murmur. No gallop.   Pulmonary:     Effort: Pulmonary effort is normal. No respiratory distress.     Breath sounds: Wheezing present. No rhonchi or rales.     Comments: Good air entry bilaterally without prolonged expiratory phase.  Trace wheezing at end of expiratory phase.  Bilaterally Musculoskeletal:     Cervical back: Neck supple. No tenderness.  Lymphadenopathy:     Cervical: No cervical adenopathy.  Skin:    Capillary Refill: Capillary refill takes less than 2 seconds.     Coloration: Skin is not jaundiced or pale.     Findings: No rash.  Neurological:     General: No focal deficit present.     Mental Status: She is alert and oriented to person, place, and time.      UC Treatments / Results  Labs (all labs ordered are listed, but only abnormal results are displayed) Labs Reviewed  NOVEL CORONAVIRUS, NAA    EKG   Radiology No  results found.  Procedures Procedures (including critical care time)  Medications Ordered in UC Medications - No data to display  Initial Impression / Assessment and Plan / UC Course  I have reviewed the triage vital signs and the nursing notes.  Pertinent labs & imaging results that were available during my care of the patient were reviewed by me and considered in my medical decision making (see chart for details).     Patient afebrile, nontoxic, with SpO2 98%.  Covid PCR pending.  Patient to quarantine until results are back.  We will treat supportively as outlined below.  Given patient's history of asthma with reported wheezing, will add prednisone to her regimen.  Return precautions discussed, patient verbalized understanding and is agreeable to plan. Final Clinical Impressions(s) / UC Diagnoses   Final diagnoses:  Viral URI with cough     Discharge Instructions     Tessalon for cough. Start flonase, atrovent nasal spray for nasal congestion/drainage. You can use over the counter nasal saline rinse such as neti pot for nasal congestion. Keep hydrated, your urine should be clear to pale yellow in color. Tylenol/motrin for fever and pain. Monitor for any worsening of symptoms, chest pain, shortness of breath, wheezing, swelling of the throat, go to the emergency department for further evaluation needed.    ED Prescriptions    Medication Sig Dispense Auth. Provider   predniSONE (DELTASONE) 20 MG tablet Take 2 tablets (40 mg total) by mouth daily for 5 days. 10 tablet Hall-Potvin, [place], PA-C   cetirizine (ZYRTEC ALLERGY) 10 MG tablet Take 1 tablet (10 mg total) by mouth daily. 30 tablet Hall-Potvin, [place], PA-C   fluticasone (FLONASE) 50 MCG/ACT nasal spray Place 1 spray into both nostrils daily. 16 g Hall-Potvin, [place], PA-C   benzonatate (TESSALON) 100 MG capsule Take 1 capsule (100 mg total) by mouth every 8 (eight) hours. 21 capsule Hall-Potvin, [place], PA-C      PDMP not reviewed this encounter.   Hall-Potvin, [place], [place] 03/30/20 1052

## 2020-03-31 LAB — SARS-COV-2, NAA 2 DAY TAT

## 2020-03-31 LAB — NOVEL CORONAVIRUS, NAA: SARS-CoV-2, NAA: NOT DETECTED

## 2021-02-27 ENCOUNTER — Ambulatory Visit
Admission: EM | Admit: 2021-02-27 | Discharge: 2021-02-27 | Disposition: A | Discharge status: Home / Self Care | Payer: Self-pay | Attending: Emergency Medicine | Admitting: Emergency Medicine

## 2021-02-27 ENCOUNTER — Other Ambulatory Visit: Payer: Self-pay

## 2021-02-27 ENCOUNTER — Encounter: Payer: Self-pay | Admitting: Emergency Medicine

## 2021-02-27 DIAGNOSIS — J4521 Mild intermittent asthma with (acute) exacerbation: Secondary | ICD-10-CM

## 2021-02-27 MED ORDER — CETIRIZINE HCL 10 MG PO TABS: 10.0000 mg | ORAL_TABLET | Freq: Every day | ORAL | 0 refills | Status: DC

## 2021-02-27 MED ORDER — AZELASTINE HCL 0.1 % NA SOLN: 2.0000 | Freq: Two times a day (BID) | NASAL | 12 refills | Status: DC

## 2021-02-27 MED ORDER — PREDNISONE 20 MG PO TABS: 40.0000 mg | ORAL_TABLET | Freq: Every day | ORAL | 0 refills | Status: AC

## 2021-02-27 MED ORDER — ALBUTEROL SULFATE HFA 108 (90 BASE) MCG/ACT IN AERS
2.0000 | INHALATION_SPRAY | Freq: Once | RESPIRATORY_TRACT | Status: AC
  Administered 2021-02-27: 2 via RESPIRATORY_TRACT

## 2021-02-27 MED ORDER — ALBUTEROL SULFATE HFA 108 (90 BASE) MCG/ACT IN AERS: 1.0000 | INHALATION_SPRAY | Freq: Four times a day (QID) | RESPIRATORY_TRACT | 0 refills | Status: DC | PRN

## 2021-02-27 NOTE — ED Provider Notes (Signed)
[name]    CSN: [id number] Arrival date & time: 02/27/21  1940      History   Chief Complaint Chief Complaint  Patient presents with  . Asthma    HPI Sara Schwartz is a 45 y.o. female.   Sara Schwartz presents with complaints of cough, congestion and wheezing which started three days ago. Ran out of her inhaler. No fever. Ear itching. No ear pain. No gi symptoms. No known ill contacts. Cough. History of asthma.   Spanish video interpreter used to collect history and physical exam.    ROS per HPI, negative if not otherwise mentioned.      Past Medical History:  Diagnosis Date  . Asthma   . Diabetes mellitus without complication (HCC)   . Gestational diabetes     Patient Active Problem List   Diagnosis Date Noted  . DUB (dysfunctional uterine bleeding) 08/07/2018  . Symptomatic anemia 07/08/2018  . 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 Seasonal allergies 06/10/2015  . Language barrier 01/12/2015  . Asthma 01/10/2015  . Dysplasia of cervix, low grade (CIN 1) 01/10/2015  . Vaginal bleeding     Past Surgical History:  Procedure Laterality Date  . NO PAST SURGERIES    . VAGINAL DELIVERY     multiple    OB History    Gravida  7   Para  7   Term  4   Preterm  3   AB  0   Living  7     SAB  0   IAB  0   Ectopic  0   Multiple  0   Live Births  7            Home Medications    Prior to Admission medications   Medication Sig Start Date End Date Taking? Authorizing Provider  albuterol (VENTOLIN HFA) 108 (90 Base) MCG/ACT inhaler Inhale 1-2 puffs into the lungs every 6 (six) hours as needed for wheezing or shortness of breath. 02/27/21  Yes Selia Wareing B, NP  azelastine (ASTELIN) 0.1 % nasal spray Place 2 sprays into both nostrils 2 (two) times daily. Use in each nostril as directed 02/27/21  Yes Vielka Klinedinst, Dorene Grebe B, NP  predniSONE (DELTASONE) 20 MG tablet Take 2 tablets (40 mg total) by mouth  daily with breakfast for 5 days. 02/27/21 03/04/21 Yes Linus Mako B, NP  aspirin EC 81 MG tablet Take 81 mg by mouth daily.    [provider]  benzonatate (TESSALON) 100 MG capsule Take 1 capsule (100 mg total) by mouth every 8 (eight) hours. 03/30/20   Hall-Potvin, [place], PA-C  cetirizine (ZYRTEC ALLERGY) 10 MG tablet Take 1 tablet (10 mg total) by mouth daily. 02/27/21   Georgetta Haber, NP  ferrous sulfate 325 (65 FE) MG tablet Take 1 tablet (325 mg total) by mouth 2 (two) times daily with a meal. Patient not taking: Reported on 01/21/2020 07/09/18   Adam Phenix, MD  fluticasone [facility]) 50 MCG/ACT nasal spray Place 1 spray into both nostrils daily. 03/30/20   Hall-Potvin, [place], PA-C  megestrol (MEGACE) 40 MG tablet Take 1 tablet (40 mg total) by mouth 2 (two) times daily. Patient not taking: Reported on 01/21/2020 09/15/18   Calvert Cantor, CNM    Family History Family History  Problem Relation Age of Onset  . Diabetes Mother   . Hypertension Mother   . Diabetes Father   . Hypertension Father  Social History Social History   Tobacco Use  . Smoking status: Former Smoker    Quit date: 02/01/2015    Years since quitting: 6.0  . Smokeless tobacco: Never Used  Vaping Use  . Vaping Use: Never used  Substance Use Topics  . Alcohol use: Yes    Comment: socially when not pregnant  . Drug use: No     Allergies   Patient has no known allergies.   Review of Systems Review of Systems   Physical Exam Triage Vital Signs ED Triage Vitals [02/27/21 2029]  Enc Vitals Group     BP      Pulse      Resp      Temp      Temp src      SpO2      Weight      Height      Head Circumference      Peak Flow      Pain Score 6     Pain Loc      Pain Edu?      Excl. in GC?    No data found.  Updated Vital Signs BP 127/82 (BP Location: Right Arm)   Pulse 81   Temp 98.5 F (36.9 C) (Oral)   Resp 20   LMP 02/09/2021   SpO2 98%    Physical  Exam Constitutional:      General: She is not in acute distress.    Appearance: She is well-developed.  HENT:     Right Ear: Tympanic membrane and ear canal normal.     Left Ear: Ear canal normal.     Nose: Rhinorrhea present.     Mouth/Throat:     Mouth: Mucous membranes are moist.  Cardiovascular:     Rate and Rhythm: Normal rate.  Pulmonary:     Effort: Pulmonary effort is normal.     Breath sounds: Wheezing present.     Comments: Congested cough noted with wheezing heard throughout lung fields  Skin:    General: Skin is warm and dry.  Neurological:     Mental Status: She is alert and oriented to person, place, and time.      UC Treatments / Results  Labs (all labs ordered are listed, but only abnormal results are displayed) Labs Reviewed  NOVEL CORONAVIRUS, NAA    EKG   Radiology No results found.  Procedures Procedures (including critical care time)  Medications Ordered in UC Medications  albuterol (VENTOLIN HFA) 108 (90 Base) MCG/ACT inhaler 2 puff (2 puffs Inhalation Given 02/27/21 2036)    Initial Impression / Assessment and Plan / UC Course  I have reviewed the triage vital signs and the nursing notes.  Pertinent labs & imaging results that were available during my care of the patient were reviewed by me and considered in my medical decision making (see chart for details).     No red flag findings. Likely viral etiology causing asthma exacerbation. Allergy management also encouraged. Return precautions provided. Patient verbalized understanding and agreeable to plan.   Final Clinical Impressions(s) / UC Diagnoses   Final diagnoses:  Mild intermittent asthma with acute exacerbation     Discharge Instructions     Please restart daily zyrtec.  Use of inhaler as needed for wheezing or shortness of breath.   5 days of prednisone.  Nasal spray twice a day.   Self isolate until covid results are back.  We will notify you by phone if it is  positive.  Your negative results will be sent through your MyChart.    If it is positive you need to isolate from others for a total of 5 days. If no fever for 24 hours without medications, and symptoms improving you may end isolation on day 6, but wear a mask if around any others for an additional 5 days.    Reinicie zyrtec diariamente. Uso del inhalador segn sea necesario para sibilancias o dificultad para respirar. 5 das de prednisona. Aerosol Psychiatrist.  Aslese [employer] de covid estn de vuelta. Te avisaremos por telfono si es positivo. Sus [employer] se enviarn a travs de Clinical cytogeneticist. Si es positivo, debe aislarse de los dems por un total de [address]. Si no tiene fiebre durante 24 horas sin medicamentos y los sntomas [address], puede finalizar el aislamiento el da 6, [place] use una mscara si est cerca de otros durante [address] [place].    ED Prescriptions    Medication Sig Dispense Auth. Provider   albuterol (VENTOLIN HFA) 108 (90 Base) MCG/ACT inhaler Inhale 1-2 puffs into the lungs every 6 (six) hours as needed for wheezing or shortness of breath. 18 g Linus Mako B, NP   cetirizine (ZYRTEC ALLERGY) 10 MG tablet Take 1 tablet (10 mg total) by mouth daily. 30 tablet Linus Mako B, NP   azelastine (ASTELIN) 0.1 % nasal spray Place 2 sprays into both nostrils 2 (two) times daily. Use in each nostril as directed 30 mL Linus Mako B, NP   predniSONE (DELTASONE) 20 MG tablet Take 2 tablets (40 mg total) by mouth daily with breakfast for 5 days. 10 tablet Georgetta Haber, NP     PDMP not reviewed this encounter.   Georgetta Haber, NP 03/01/21 [phone]

## 2021-02-27 NOTE — Discharge Instructions (Addendum)
Please restart daily zyrtec.  Use of inhaler as needed for wheezing or shortness of breath.   5 days of prednisone.  Nasal spray twice a day.   Self isolate until covid results are back.  We will notify you by phone if it is positive. Your negative results will be sent through your MyChart.    If it is positive you need to isolate from others for a total of 5 days. If no fever for 24 hours without medications, and symptoms improving you may end isolation on day 6, but wear a mask if around any others for an additional 5 days.    Reinicie zyrtec diariamente. Uso del inhalador segn sea necesario para sibilancias o dificultad para respirar. 5 das de prednisona. Aerosol Psychiatrist.  Aslese [employer] de covid estn de vuelta. Te avisaremos por telfono si es positivo. Sus [employer] se enviarn a travs de Clinical cytogeneticist. Si es positivo, debe aislarse de los dems por un total de [address]. Si no tiene fiebre durante 24 horas sin medicamentos y los sntomas [address], puede finalizar el aislamiento el da 6, [place] use una mscara si est cerca de otros durante [address] [place].

## 2021-02-27 NOTE — ED Triage Notes (Signed)
Seen by provider

## 2021-03-01 LAB — NOVEL CORONAVIRUS, NAA: SARS-CoV-2, NAA: NOT DETECTED

## 2021-03-01 LAB — SARS-COV-2, NAA 2 DAY TAT

## 2021-04-03 ENCOUNTER — Other Ambulatory Visit: Payer: Self-pay

## 2021-04-03 ENCOUNTER — Encounter: Payer: Self-pay | Admitting: Emergency Medicine

## 2021-04-03 ENCOUNTER — Ambulatory Visit
Admission: EM | Admit: 2021-04-03 | Discharge: 2021-04-03 | Disposition: A | Discharge status: Home / Self Care | Payer: Self-pay | Attending: Emergency Medicine | Admitting: Emergency Medicine

## 2021-04-03 DIAGNOSIS — J069 Acute upper respiratory infection, unspecified: Secondary | ICD-10-CM

## 2021-04-03 DIAGNOSIS — J4520 Mild intermittent asthma, uncomplicated: Secondary | ICD-10-CM

## 2021-04-03 MED ORDER — BENZONATATE 200 MG PO CAPS: 200.0000 mg | ORAL_CAPSULE | Freq: Three times a day (TID) | ORAL | 0 refills | Status: AC | PRN

## 2021-04-03 MED ORDER — ALBUTEROL SULFATE HFA 108 (90 BASE) MCG/ACT IN AERS: 1.0000 | INHALATION_SPRAY | Freq: Four times a day (QID) | RESPIRATORY_TRACT | 0 refills | Status: DC | PRN

## 2021-04-03 MED ORDER — DM-GUAIFENESIN ER 30-600 MG PO TB12: 1.0000 | ORAL_TABLET | Freq: Two times a day (BID) | ORAL | 0 refills | Status: DC

## 2021-04-03 MED ORDER — PREDNISONE 20 MG PO TABS: 40.0000 mg | ORAL_TABLET | Freq: Every day | ORAL | 0 refills | Status: AC

## 2021-04-03 MED ORDER — FLUTICASONE PROPIONATE 50 MCG/ACT NA SUSP: 1.0000 | Freq: Every day | NASAL | 0 refills | Status: DC

## 2021-04-03 NOTE — Discharge Instructions (Addendum)
COVID test pending, monitor MyChart for results Continue to use albuterol inhaler 1 to 2 puffs every 4-6 hours for shortness of breath, chest tightness and wheezing Prednisone 40 mg daily x5 days to further help with asthma Mucinex DM twice daily for cough and congestion Tessalon every 8 hours for cough Flonase nasal spray 1 to 2 spray in each nostril daily to further help with congestion Rest and fluids Follow-up if not improving or worsening

## 2021-04-03 NOTE — ED Provider Notes (Signed)
[name]    CSN: [id number] Arrival date & time: 04/03/21  0847      History   Chief Complaint Chief Complaint  Patient presents with  . Cough    HPI Sara Schwartz is a 45 y.o. female history of asthma, DM type II presenting today for evaluation of cough.  Reports cough for approximately 4 days.  Associated congestion and rhinorrhea.  Denies fevers.  Using albuterol inhaler and OTC meds without relief.  Denies fevers.  Denies prior COVID testing.  Reports she has had some wheezing and felt that her asthma flared.  HPI  Past Medical History:  Diagnosis Date  . Asthma   . Diabetes mellitus without complication (HCC)   . Gestational diabetes     Patient Active Problem List   Diagnosis Date Noted  . DUB (dysfunctional uterine bleeding) 08/07/2018  . Symptomatic anemia 07/08/2018  . 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 Seasonal allergies 06/10/2015  . Language barrier 01/12/2015  . Asthma 01/10/2015  . Dysplasia of cervix, low grade (CIN 1) 01/10/2015  . Vaginal bleeding     Past Surgical History:  Procedure Laterality Date  . NO PAST SURGERIES    . VAGINAL DELIVERY     multiple    OB History    Gravida  7   Para  7   Term  4   Preterm  3   AB  0   Living  7     SAB  0   IAB  0   Ectopic  0   Multiple  0   Live Births  7            Home Medications    Prior to Admission medications   Medication Sig Start Date End Date Taking? Authorizing Provider  albuterol (VENTOLIN HFA) 108 (90 Base) MCG/ACT inhaler Inhale 1-2 puffs into the lungs every 6 (six) hours as needed for wheezing or shortness of breath. 04/03/21  Yes Dinesha Twiggs C, PA-C  aspirin EC 81 MG tablet Take 81 mg by mouth daily.   Yes [provider]  azelastine (ASTELIN) 0.1 % nasal spray Place 2 sprays into both nostrils 2 (two) times daily. Use in each nostril as directed 02/27/21  Yes Burky, Barron Alvine, NP  benzonatate (TESSALON) 200  MG capsule Take 1 capsule (200 mg total) by mouth 3 (three) times daily as needed for up to 7 days for cough. 04/03/21 04/10/21 Yes Oney Folz C, PA-C  cetirizine (ZYRTEC ALLERGY) 10 MG tablet Take 1 tablet (10 mg total) by mouth daily. 02/27/21  Yes Burky, Barron Alvine, NP  dextromethorphan-guaiFENesin (MUCINEX DM) 30-600 MG 12hr tablet Take 1 tablet by mouth 2 (two) times daily. 04/03/21  Yes Dejanira Pamintuan C, PA-C  fluticasone (FLONASE) 50 MCG/ACT nasal spray Place 1-2 sprays into both nostrils daily. 04/03/21  Yes Dempsy Damiano C, PA-C  predniSONE (DELTASONE) 20 MG tablet Take 2 tablets (40 mg total) by mouth daily with breakfast for 5 days. 04/03/21 04/08/21 Yes Sherral Dirocco C, PA-C  ferrous sulfate 325 (65 FE) MG tablet Take 1 tablet (325 mg total) by mouth 2 (two) times daily with a meal. Patient not taking: No sig reported 07/09/18 04/03/21  Adam Phenix, MD    Family History Family History  Problem Relation Age of Onset  . Diabetes Mother   . Hypertension Mother   . Diabetes Father   . Hypertension Father     Social History Social History  Tobacco Use  . Smoking status: Former Smoker    Quit date: 02/01/2015    Years since quitting: 6.1  . Smokeless tobacco: Never Used  Vaping Use  . Vaping Use: Never used  Substance Use Topics  . Alcohol use: Yes    Comment: socially when not pregnant  . Drug use: No     Allergies   Patient has no known allergies.   Review of Systems Review of Systems  Constitutional: Negative for activity change, appetite change, chills, fatigue and fever.  HENT: Positive for congestion, rhinorrhea and sore throat. Negative for ear pain, sinus pressure and trouble swallowing.   Eyes: Negative for discharge and redness.  Respiratory: Positive for cough and wheezing. Negative for chest tightness and shortness of breath.   Cardiovascular: Negative for chest pain.  Gastrointestinal: Negative for abdominal pain, diarrhea, nausea and vomiting.   Musculoskeletal: Negative for myalgias.  Skin: Negative for rash.  Neurological: Negative for dizziness, light-headedness and headaches.     Physical Exam Triage Vital Signs ED Triage Vitals [04/03/21 0928]  Enc Vitals Group     BP 113/72     Pulse Rate 93     Resp      Temp 98.5 F (36.9 C)     Temp Source Oral     SpO2 100 %     Weight      Height      Head Circumference      Peak Flow      Pain Score      Pain Loc      Pain Edu?      Excl. in GC?    No data found.  Updated Vital Signs BP 113/72 (BP Location: Right Arm)   Pulse 93   Temp 98.5 F (36.9 C) (Oral)   LMP 03/13/2021   SpO2 100%   Visual Acuity Right Eye Distance:   Left Eye Distance:   Bilateral Distance:    Right Eye Near:   Left Eye Near:    Bilateral Near:     Physical Exam Vitals and nursing note reviewed.  Constitutional:      Appearance: She is well-developed.     Comments: No acute distress  HENT:     Head: Normocephalic and atraumatic.     Ears:     Comments: Bilateral ears without tenderness to palpation of external auricle, tragus and mastoid, EAC's without erythema or swelling, TM's with good bony landmarks and cone of light. Non erythematous.     Nose: Nose normal.     Mouth/Throat:     Comments: Oral mucosa pink and moist, no tonsillar enlargement or exudate. Posterior pharynx patent and nonerythematous, no uvula deviation or swelling. Normal phonation. Eyes:     Conjunctiva/sclera: Conjunctivae normal.  Cardiovascular:     Rate and Rhythm: Normal rate.  Pulmonary:     Effort: Pulmonary effort is normal. No respiratory distress.     Comments: Breathing comfortably at rest, CTABL, no wheezing, rales or other adventitious sounds auscultated Abdominal:     General: There is no distension.  Musculoskeletal:        General: Normal range of motion.     Cervical back: Neck supple.  Skin:    General: Skin is warm and dry.  Neurological:     Mental Status: She is alert and  oriented to person, place, and time.      UC Treatments / Results  Labs (all labs ordered are listed, but only abnormal results are  displayed) Labs Reviewed  NOVEL CORONAVIRUS, NAA    EKG   Radiology No results found.  Procedures Procedures (including critical care time)  Medications Ordered in UC Medications - No data to display  Initial Impression / Assessment and Plan / UC Course  I have reviewed the triage vital signs and the nursing notes.  Pertinent labs & imaging results that were available during my care of the patient were reviewed by me and considered in my medical decision making (see chart for details).     Viral URI with cough in setting of asthma- continue symptomatic and supportive care, lungs clear to auscultation, but given patient has reported wheezing will place on course of prednisone as well as refill albuterol inhaler, continue to monitor symptoms over the next 3 to 4 days and monitor for gradual resolution.  COVID test pending for screening.  Discussed strict return precautions. Patient verbalized understanding and is agreeable with plan.  Final Clinical Impressions(s) / UC Diagnoses   Final diagnoses:  Viral URI with cough  Mild intermittent asthma without complication     Discharge Instructions     COVID test pending, monitor MyChart for results Continue to use albuterol inhaler 1 to 2 puffs every 4-6 hours for shortness of breath, chest tightness and wheezing Prednisone 40 mg daily x5 days to further help with asthma Mucinex DM twice daily for cough and congestion Tessalon every 8 hours for cough Flonase nasal spray 1 to 2 spray in each nostril daily to further help with congestion Rest and fluids Follow-up if not improving or worsening    ED Prescriptions    Medication Sig Dispense Auth. Provider   benzonatate (TESSALON) 200 MG capsule Take 1 capsule (200 mg total) by mouth 3 (three) times daily as needed for up to 7 days for cough.  28 capsule Caragh Gasper C, PA-C   albuterol (VENTOLIN HFA) 108 (90 Base) MCG/ACT inhaler Inhale 1-2 puffs into the lungs every 6 (six) hours as needed for wheezing or shortness of breath. 18 g Nicola Heinemann C, PA-C   fluticasone (FLONASE) 50 MCG/ACT nasal spray Place 1-2 sprays into both nostrils daily. 16 g Ercell Razon C, PA-C   dextromethorphan-guaiFENesin (MUCINEX DM) 30-600 MG 12hr tablet Take 1 tablet by mouth 2 (two) times daily. 20 tablet Alroy Portela C, PA-C   predniSONE (DELTASONE) 20 MG tablet Take 2 tablets (40 mg total) by mouth daily with breakfast for 5 days. 10 tablet Kyleeann Cremeans, [place] C, PA-C     PDMP not reviewed this encounter.   Marriana Hibberd, [place] C, PA-C 04/03/21 1001

## 2021-04-03 NOTE — ED Triage Notes (Signed)
Patient states that she has had a non-productive cough x 4 days.  Patient also has runny nose and congestion, denies fever.  Patient has taken an Albuterol Inhaler and OTC cold meds.  Patient is not vaccinated.

## 2021-04-04 LAB — NOVEL CORONAVIRUS, NAA: SARS-CoV-2, NAA: NOT DETECTED

## 2021-04-21 ENCOUNTER — Ambulatory Visit
Admission: EM | Admit: 2021-04-21 | Discharge: 2021-04-21 | Disposition: A | Discharge status: Home / Self Care | Payer: Self-pay | Attending: Family Medicine | Admitting: Family Medicine

## 2021-04-21 DIAGNOSIS — J4541 Moderate persistent asthma with (acute) exacerbation: Secondary | ICD-10-CM

## 2021-04-21 DIAGNOSIS — R059 Cough, unspecified: Secondary | ICD-10-CM

## 2021-04-21 MED ORDER — PREDNISONE 20 MG PO TABS: 40.0000 mg | ORAL_TABLET | Freq: Every day | ORAL | 0 refills | Status: DC

## 2021-04-21 MED ORDER — PROMETHAZINE-DM 6.25-15 MG/5ML PO SYRP: 5.0000 mL | ORAL_SOLUTION | Freq: Three times a day (TID) | ORAL | 0 refills | Status: DC | PRN

## 2021-04-21 MED ORDER — DEXAMETHASONE 1 MG/ML PO CONC
10.0000 mg | Freq: Once | ORAL | Status: AC
  Administered 2021-04-21: 10 mg via ORAL

## 2021-04-21 MED ORDER — ALBUTEROL SULFATE HFA 108 (90 BASE) MCG/ACT IN AERS
2.0000 | INHALATION_SPRAY | Freq: Once | RESPIRATORY_TRACT | Status: AC
  Administered 2021-04-21: 2 via RESPIRATORY_TRACT

## 2021-04-21 NOTE — ED Triage Notes (Signed)
Pt c/o cough and congestion that started this am. States has used her inhaler x4 today. States while here for her son to be seen started wheezing 2 hrs ago and doesn't have her inhaler. Audible wheezing noted. Pt speaking in complete sentences.

## 2021-09-12 ENCOUNTER — Ambulatory Visit
Admission: EM | Admit: 2021-09-12 | Discharge: 2021-09-12 | Disposition: A | Discharge status: Home / Self Care | Payer: Self-pay | Attending: Physician Assistant | Admitting: Physician Assistant

## 2021-09-12 ENCOUNTER — Other Ambulatory Visit: Payer: Self-pay

## 2021-09-12 DIAGNOSIS — J45901 Unspecified asthma with (acute) exacerbation: Secondary | ICD-10-CM

## 2021-09-12 MED ORDER — ALBUTEROL SULFATE HFA 108 (90 BASE) MCG/ACT IN AERS: 1.0000 | INHALATION_SPRAY | Freq: Four times a day (QID) | RESPIRATORY_TRACT | 0 refills | Status: DC | PRN

## 2021-09-12 MED ORDER — PREDNISONE 20 MG PO TABS: 40.0000 mg | ORAL_TABLET | Freq: Every day | ORAL | 0 refills | Status: AC

## 2021-09-12 NOTE — ED Triage Notes (Signed)
Onset yesterday of chest tightness. Pt notes hx of asthma and needs an albuterol refill. Denies chest pain, cough and nasal congestion.

## 2021-09-12 NOTE — ED Provider Notes (Signed)
[name]    CSN: [id number] Arrival date & time: 09/12/21  1926      History   Chief Complaint Chief Complaint  Patient presents with   Chest Pain    Tightness    HPI Sara Schwartz is a 45 y.o. female.   Patient here today for evaluation of possible asthma exacerbation.  She reports that she is having some shortness of breath and chest tightness.  She denies any chest pain.  She has not had her inhaler to take, but has been taking over-the-counter medicine without significant relief.  The history is provided by the patient. The history is limited by a language barrier. A language interpreter was used (natalie- stratus).  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 Authorizing Provider  albuterol (VENTOLIN HFA) 108 (90 Base) MCG/ACT inhaler Inhale 1-2 puffs into the lungs every 6 (six) hours as needed for wheezing or shortness of breath. 09/12/21  Yes Tomi Bamberger, PA-C  predniSONE (DELTASONE) 20  MG tablet Take 2 tablets (40 mg total) by mouth daily with breakfast for 5 days. 09/12/21 09/17/21 Yes Tomi Bamberger, PA-C  aspirin EC 81 MG tablet Take 81 mg by mouth daily.    [provider]  azelastine (ASTELIN) 0.1 % nasal spray Place 2 sprays into both nostrils 2 (two) times daily. Use in each nostril as directed 02/27/21   Georgetta Haber, NP  cetirizine (ZYRTEC ALLERGY) 10 MG tablet Take 1 tablet (10 mg total) by mouth daily. 02/27/21   Georgetta Haber, NP  dextromethorphan-guaiFENesin (MUCINEX DM) 30-600 MG 12hr tablet Take 1 tablet by mouth 2 (two) times daily. 04/03/21   Wieters, Hallie C, PA-C  fluticasone (FLONASE) 50 MCG/ACT nasal spray Place 1-2 sprays into both nostrils daily. 04/03/21   Wieters, Hallie C, PA-C  promethazine-dextromethorphan (PROMETHAZINE-DM) 6.25-15 MG/5ML syrup Take 5 mLs by mouth 3 (three) times daily as needed for cough. 04/21/21   Bing Neighbors, FNP  ferrous sulfate 325 (65 FE) MG tablet Take 1 tablet (325 mg total) by mouth 2 (two) times daily with a meal. Patient not taking: No sig reported 07/09/18 04/03/21  Adam Phenix, MD  Family History Family History  Problem Relation Age of Onset   Diabetes Mother    Hypertension Mother    Diabetes Father    Hypertension Father     Social History Social History   Tobacco Use   Smoking status: Former    Types: Cigarettes    Quit date: 02/01/2015    Years since quitting: 6.6   Smokeless tobacco: Never  Vaping Use   Vaping Use: Never used  Substance Use Topics   Alcohol use: Yes    Comment: socially when not pregnant   Drug use: No     Allergies   Patient has no known allergies.   Review of Systems Review of Systems  Constitutional:  Negative for chills and fever.  HENT:  Negative for congestion, ear pain, sinus pressure and sore throat.   Eyes:  Negative for discharge and redness.  Respiratory:  Positive for cough, shortness of breath and wheezing.   Cardiovascular:  Negative for  chest pain.  Gastrointestinal:  Negative for abdominal pain, diarrhea, nausea and vomiting.    Physical Exam Triage Vital Signs ED Triage Vitals  Enc Vitals Group     BP 09/12/21 1935 (!) 153/94     Pulse Rate 09/12/21 1935 95     Resp 09/12/21 1935 18     Temp 09/12/21 1935 98.3 F (36.8 C)     Temp Source 09/12/21 1935 Oral     SpO2 09/12/21 1935 98 %     Weight --      Height --      Head Circumference --      Peak Flow --      Pain Score 09/12/21 1938 7     Pain Loc --      Pain Edu? --      Excl. in GC? --    No data found.  Updated Vital Signs BP (!) 153/94 (BP Location: Left Arm)   Pulse 95   Temp 98.3 F (36.8 C) (Oral)   Resp 18   SpO2 98%      Physical Exam Vitals and nursing note reviewed.  Constitutional:      General: She is not in acute distress.    Appearance: Normal appearance. She is not ill-appearing.  HENT:     Head: Normocephalic and atraumatic.     Right Ear: Tympanic membrane normal.     Left Ear: Tympanic membrane normal.     Nose: Congestion present.     Mouth/Throat:     Mouth: Mucous membranes are moist.     Pharynx: No oropharyngeal exudate or posterior oropharyngeal erythema.  Eyes:     Conjunctiva/sclera: Conjunctivae normal.  Cardiovascular:     Rate and Rhythm: Normal rate and regular rhythm.     Heart sounds: Normal heart sounds. No murmur heard. Pulmonary:     Effort: Pulmonary effort is normal. No respiratory distress.     Breath sounds: Wheezing (diffuse scattered mild) present. No rhonchi or rales.  Skin:    General: Skin is warm and dry.  Neurological:     Mental Status: She is alert.  Psychiatric:        Mood and Affect: Mood normal.        Thought Content: Thought content normal.     UC Treatments / Results  Labs (all labs ordered are listed, but only abnormal results are displayed) Labs Reviewed - No data to display  EKG   Radiology No results found.  Procedures Procedures (including critical  care  time)  Medications Ordered in UC Medications - No data to display  Initial Impression / Assessment and Plan / UC Course  I have reviewed the triage vital signs and the nursing notes.  Pertinent labs & imaging results that were available during my care of the patient were reviewed by me and considered in my medical decision making (see chart for details).  Suspect likely asthma exacerbation.  Will treat with prednisone burst and albuterol inhaler refilled.  Encouraged follow-up if symptoms fail to improve or worsen anyway.  Final Clinical Impressions(s) / UC Diagnoses   Final diagnoses:  Asthma with acute exacerbation, unspecified asthma severity, unspecified whether persistent   Discharge Instructions   None    ED Prescriptions     Medication Sig Dispense Auth. Provider   albuterol (VENTOLIN HFA) 108 (90 Base) MCG/ACT inhaler Inhale 1-2 puffs into the lungs every 6 (six) hours as needed for wheezing or shortness of breath. 8 g Erma Pinto F, PA-C   predniSONE (DELTASONE) 20 MG tablet Take 2 tablets (40 mg total) by mouth daily with breakfast for 5 days. 10 tablet Tomi Bamberger, PA-C      PDMP not reviewed this encounter.   Tomi Bamberger, PA-C 09/12/21 1953

## 2022-01-29 ENCOUNTER — Ambulatory Visit
Admission: EM | Admit: 2022-01-29 | Discharge: 2022-01-29 | Disposition: A | Discharge status: Home / Self Care | Payer: Self-pay | Attending: Family Medicine | Admitting: Family Medicine

## 2022-01-29 ENCOUNTER — Encounter: Payer: Self-pay | Admitting: Emergency Medicine

## 2022-01-29 ENCOUNTER — Other Ambulatory Visit: Payer: Self-pay

## 2022-01-29 DIAGNOSIS — J4521 Mild intermittent asthma with (acute) exacerbation: Secondary | ICD-10-CM

## 2022-01-29 MED ORDER — ALBUTEROL SULFATE HFA 108 (90 BASE) MCG/ACT IN AERS: 1.0000 | INHALATION_SPRAY | Freq: Four times a day (QID) | RESPIRATORY_TRACT | 2 refills | Status: AC | PRN

## 2022-01-29 MED ORDER — PREDNISONE 20 MG PO TABS: 40.0000 mg | ORAL_TABLET | Freq: Every day | ORAL | 0 refills | Status: DC

## 2022-01-29 NOTE — ED Triage Notes (Addendum)
Cough, headache, runny nose for four days. Denies N/V/D, body aches, fever, sore throat. Pt declined interpreter for visit

## 2022-01-31 NOTE — ED Provider Notes (Signed)
[facility]   [id number] 01/29/22 Arrival Time: [id number]  ASSESSMENT & PLAN:  1. Mild intermittent asthma with acute exacerbation    No resp distress. Begin: Meds ordered this encounter  Medications   albuterol (VENTOLIN HFA) 108 (90 Base) MCG/ACT inhaler    Sig: Inhale 1-2 puffs into the lungs every 6 (six) hours as needed for wheezing or shortness of breath.    Dispense:  8 g    Refill:  2   predniSONE (DELTASONE) 20 MG tablet    Sig: Take 2 tablets (40 mg total) by mouth daily.    Dispense:  10 tablet    Refill:  0   Asthma precautions given. OTC symptom care as needed.  Recommend:  Follow-up Information     [facility] at [facility] .   Specialty: Urgent Care Why: If worsening or failing to improve as anticipated. Contact information: [address] [place] [id number] [place] [phone]                Reviewed expectations re: course of current medical issues. Questions answered. Outlined signs and symptoms indicating need for more acute intervention. Patient verbalized understanding. After Visit Summary given.  SUBJECTIVE: History from: patient.  Sara Schwartz is a 46 y.o. female who presents with complaint of URI symptoms; few days; improving. Wheezing now/asthma exacerbation. Afebrile. No SOB/CP reported. Needs inhaler.  Social History   Tobacco Use  Smoking Status Former   Types: Cigarettes   Quit date: 02/01/2015   Years since quitting: 7.0  Smokeless Tobacco Never   OBJECTIVE:  Vitals:   01/29/22 1038  BP: (!) 146/92  Pulse: 88  Resp: 16  Temp: 98.7 F (37.1 C)  TempSrc: Oral  SpO2: 97%     General appearance: alert; NAD HEENT: [place]; AT; with mild nasal congestion Neck: supple without LAD Cv: RRR without murmer Lungs: unlabored respirations,  mild to mod  bilateral expiratory wheezing; cough: absent; no significant respiratory distress Skin: warm and  dry Psychological: alert and cooperative; normal mood and affect  No Known Allergies  Past Medical History:  Diagnosis Date   Asthma    Diabetes mellitus without complication (HCC)    Gestational diabetes    Family History  Problem Relation Age of Onset   Diabetes Mother    Hypertension Mother    Diabetes Father    Hypertension Father    Social History   Socioeconomic History   Marital status: Married    Spouse name: Not on file   Number of children: Not on file   Years of education: Not on file   Highest education level: Not on file  Occupational History   Not on file  Tobacco Use   Smoking status: Former    Types: Cigarettes    Quit date: 02/01/2015    Years since quitting: 7.0   Smokeless tobacco: Never  Vaping Use   Vaping Use: Never used  Substance and Sexual Activity   Alcohol use: Yes    Comment: socially when not pregnant   Drug use: No   Sexual activity: Not Currently    Birth control/protection: None  Other Topics Concern   Not on file  Social History Narrative   Not on file   Social Determinants of Health   Financial Resource Strain: Not on file  Food Insecurity: Not on file  Transportation Needs: Not on file  Physical Activity: Not on file  Stress: Not on file  Social Connections: Not  on file  Intimate Partner Violence: Not on file             Vanessa Kick, MD 01/31/22 [phone]

## 2022-02-05 ENCOUNTER — Encounter (HOSPITAL_COMMUNITY): Payer: Self-pay | Admitting: Emergency Medicine

## 2022-02-05 ENCOUNTER — Emergency Department (HOSPITAL_COMMUNITY)
Admission: EM | Admit: 2022-02-05 | Discharge: 2022-02-06 | Disposition: A | Discharge status: Home / Self Care | Payer: Self-pay | Attending: Emergency Medicine | Admitting: Emergency Medicine

## 2022-02-05 ENCOUNTER — Other Ambulatory Visit: Payer: Self-pay

## 2022-02-05 DIAGNOSIS — Z7951 Long term (current) use of inhaled steroids: Secondary | ICD-10-CM | POA: Insufficient documentation

## 2022-02-05 DIAGNOSIS — Z20822 Contact with and (suspected) exposure to covid-19: Secondary | ICD-10-CM | POA: Insufficient documentation

## 2022-02-05 DIAGNOSIS — E1165 Type 2 diabetes mellitus with hyperglycemia: Secondary | ICD-10-CM | POA: Insufficient documentation

## 2022-02-05 DIAGNOSIS — J069 Acute upper respiratory infection, unspecified: Secondary | ICD-10-CM | POA: Insufficient documentation

## 2022-02-05 DIAGNOSIS — Z7982 Long term (current) use of aspirin: Secondary | ICD-10-CM | POA: Insufficient documentation

## 2022-02-05 DIAGNOSIS — J4521 Mild intermittent asthma with (acute) exacerbation: Secondary | ICD-10-CM | POA: Insufficient documentation

## 2022-02-05 MED ORDER — ALBUTEROL SULFATE (2.5 MG/3ML) 0.083% IN NEBU
5.0000 mg | INHALATION_SOLUTION | Freq: Once | RESPIRATORY_TRACT | Status: AC
  Administered 2022-02-05: 5 mg via RESPIRATORY_TRACT
  Filled 2022-02-05: qty 6

## 2022-02-05 MED ORDER — PREDNISONE 20 MG PO TABS
60.0000 mg | ORAL_TABLET | Freq: Once | ORAL | Status: AC
  Administered 2022-02-05: 60 mg via ORAL
  Filled 2022-02-05: qty 3

## 2022-02-05 MED ORDER — IPRATROPIUM BROMIDE 0.02 % IN SOLN
0.5000 mg | Freq: Once | RESPIRATORY_TRACT | Status: AC
Start: 2022-02-05 — End: 2022-02-05
  Administered 2022-02-05: 0.5 mg via RESPIRATORY_TRACT
  Filled 2022-02-05: qty 2.5

## 2022-02-05 NOTE — ED Triage Notes (Signed)
Patient reports worsening asthma with wheezing , chest congestion and occasional productive cough , hypertensive at triage , no fever or chills .  ?

## 2022-02-05 NOTE — ED Provider Triage Note (Signed)
Emergency Medicine Provider Triage Evaluation Note ? ?Sara Schwartz , a 46 y.o. female  was evaluated in triage.  Pt complains of vaginal onset, constant, wheezing for the past week.  Patient also complains of chest tightness, rhinorrhea, congestion.  She states that she was seen at urgent care last week and provided with an albuterol inhaler which she has been using quite frequently without relief.  She finished taking prednisone last week without relief.  She denies any recent sick contacts.  Denies any fevers or chills. ? ?Review of Systems  ?Positive: + chest tightness, sob, wheezing, congestion, rhinorrhea ?Negative: - fevers ? ?Physical Exam  ?BP (!) 165/84   Pulse (!) 114   Temp 98.6 ?F (37 ?C) (Oral)   Resp 18   Ht 5\' 4"  (1.626 m)   Wt 120 kg   LMP 01/24/2022   SpO2 99%   BMI 45.41 kg/m?  ?Gen:   Awake, no distress   ?Resp:  Audible wheezing. Speaking in full sentences. Satting 99% on RA.  ?MSK:   Moves extremities without difficulty  ?Other:   ? ?Medical Decision Making  ?Medically screening exam initiated at 11:29 PM.  Appropriate orders placed.  Moria Brophy was informed that the remainder of the evaluation will be completed by another provider, this initial triage assessment does not replace that evaluation, and the importance of remaining in the ED until their evaluation is complete. ? ? ?  ?Sara Lower, PA-C ?02/05/22 2330 ? ?

## 2022-02-06 ENCOUNTER — Emergency Department (HOSPITAL_COMMUNITY): Payer: Self-pay

## 2022-02-06 ENCOUNTER — Encounter (HOSPITAL_COMMUNITY): Payer: Self-pay

## 2022-02-06 LAB — I-STAT BETA HCG BLOOD, ED (MC, WL, AP ONLY): I-stat hCG, quantitative: <5 m[IU]/mL (ref <5)

## 2022-02-06 LAB — CBG MONITORING, ED: Glucose-Capillary: 175 mg/dL — ABNORMAL HIGH (ref 70–99)

## 2022-02-06 LAB — RESP PANEL BY RT-PCR (FLU A&B, COVID) ARPGX2
Influenza A by PCR: NEGATIVE
Influenza B by PCR: NEGATIVE
SARS Coronavirus 2 by RT PCR: NEGATIVE

## 2022-02-06 MED ORDER — GUAIFENESIN 100 MG/5ML PO LIQD: 100.0000 mg | ORAL | 0 refills | Status: DC | PRN

## 2022-02-06 MED ORDER — AEROCHAMBER PLUS FLO-VU MEDIUM MISC
1.0000 | Freq: Once | Status: AC
  Filled 2022-02-06: qty 1

## 2022-02-06 MED ORDER — AEROCHAMBER PLUS FLO-VU LARGE MISC
Status: AC
  Administered 2022-02-06: 1
  Filled 2022-02-06: qty 1

## 2022-02-06 MED ORDER — IPRATROPIUM-ALBUTEROL 0.5-2.5 (3) MG/3ML IN SOLN
3.0000 mL | Freq: Once | RESPIRATORY_TRACT | Status: AC
  Administered 2022-02-06: 3 mL via RESPIRATORY_TRACT
  Filled 2022-02-06: qty 3

## 2022-02-06 MED ORDER — IBUPROFEN 600 MG PO TABS: 600.0000 mg | ORAL_TABLET | Freq: Four times a day (QID) | ORAL | 0 refills | Status: AC | PRN

## 2022-02-06 MED ORDER — OXYMETAZOLINE HCL 0.05 % NA SOLN
1.0000 | Freq: Once | NASAL | Status: AC
  Administered 2022-02-06: 1 via NASAL
  Filled 2022-02-06: qty 30

## 2022-02-06 MED ORDER — LORATADINE 10 MG PO TABS: 10.0000 mg | ORAL_TABLET | Freq: Every day | ORAL | 0 refills | Status: DC | PRN

## 2022-02-06 MED ORDER — HYDROCOD POLI-CHLORPHE POLI ER 10-8 MG/5ML PO SUER
5.0000 mL | Freq: Once | ORAL | Status: AC
  Administered 2022-02-06: 5 mL via ORAL
  Filled 2022-02-06: qty 5

## 2022-02-06 MED ORDER — AEROCHAMBER Z-STAT PLUS/MEDIUM MISC
1.0000 | Freq: Once | Status: DC
  Filled 2022-02-06: qty 1

## 2022-02-06 MED ORDER — METHYLPREDNISOLONE SODIUM SUCC 125 MG IJ SOLR
125.0000 mg | Freq: Once | INTRAMUSCULAR | Status: AC
  Administered 2022-02-06: 125 mg via INTRAVENOUS
  Filled 2022-02-06: qty 2

## 2022-02-06 NOTE — ED Provider Notes (Signed)
[facility] EMERGENCY DEPARTMENT Provider Note   CSN: [id number] Arrival date & time: 02/05/22  2308     History  Chief Complaint  Patient presents with   Asthma / Wheezing    Sara Schwartz is a 46 y.o. female.  Pt is a 46 yo female with a hx of asthma and dm.  She said she has had wheezing for a week.  She went to UC who gave her an albuterol inhaler (no spacer) and steroids.  She is not feeling any better.  She continues to have cough and congestion.  She has not had fevers.  She was given a duoneb and prednisone several hours ago last night in triage.  Pt had Covid about a month ago and is also exposed to a lot of fumes at work.  She said the fumes make her breathing worse.  She is inside, but windows are open.  No respirator or mask.  Due to language barrier, an interpreter was present during the history-taking and subsequent discussion (and for part of the physical exam) with this patient.       Home Medications Prior to Admission medications   Medication Sig Start Date End Date Taking? Authorizing Provider  albuterol (VENTOLIN HFA) 108 (90 Base) MCG/ACT inhaler Inhale 1-2 puffs into the lungs every 6 (six) hours as needed for wheezing or shortness of breath. 01/29/22  Yes Mardella Layman, MD  aspirin EC 81 MG tablet Take 81 mg by mouth daily.   Yes [provider]  azelastine (ASTELIN) 0.1 % nasal spray Place 2 sprays into both nostrils 2 (two) times daily. Use in each nostril as directed 02/27/21  Yes Burky, Barron Alvine, NP  cetirizine (ZYRTEC ALLERGY) 10 MG tablet Take 1 tablet (10 mg total) by mouth daily. 02/27/21  Yes Burky, Barron Alvine, NP  dextromethorphan-guaiFENesin (MUCINEX DM) 30-600 MG 12hr tablet Take 1 tablet by mouth 2 (two) times daily. 04/03/21  Yes Wieters, Hallie C, PA-C  fluticasone (FLONASE) 50 MCG/ACT nasal spray Place 1-2 sprays into both nostrils daily. 04/03/21  Yes Wieters, Hallie C, PA-C  guaiFENesin (ROBITUSSIN) 100 MG/5ML  liquid Take 5-10 mLs (100-200 mg total) by mouth every 4 (four) hours as needed for cough or to loosen phlegm. 02/06/22  Yes Jacalyn Lefevre, MD  ibuprofen (ADVIL) 600 MG tablet Take 1 tablet (600 mg total) by mouth every 6 (six) hours as needed. 02/06/22  Yes Jacalyn Lefevre, MD  loratadine (CLARITIN) 10 MG tablet Take 1 tablet (10 mg total) by mouth daily as needed for allergies. 02/06/22  Yes Jacalyn Lefevre, MD  promethazine-dextromethorphan (PROMETHAZINE-DM) 6.25-15 MG/5ML syrup Take 5 mLs by mouth 3 (three) times daily as needed for cough. 04/21/21  Yes Bing Neighbors, FNP  predniSONE (DELTASONE) 20 MG tablet Take 2 tablets (40 mg total) by mouth daily. Patient not taking: Reported on 02/06/2022 01/29/22   Mardella Layman, MD  ferrous sulfate 325 (65 FE) MG tablet Take 1 tablet (325 mg total) by mouth 2 (two) times daily with a meal. Patient not taking: No sig reported 07/09/18 04/03/21  Adam Phenix, MD      Allergies    Patient has no known allergies.    Review of Systems   Review of Systems  HENT:  Positive for congestion.   Respiratory:  Positive for cough and wheezing.   All other systems reviewed and are negative.  Physical Exam Updated Vital Signs BP 120/65    Pulse 89    Temp 98.6 F (  37 C) (Oral)    Resp 16    Ht 5\' 4"  (1.626 m)    Wt 120 kg    LMP 01/24/2022    SpO2 100%    BMI 45.41 kg/m  Physical Exam Vitals and nursing note reviewed.  Constitutional:      Appearance: Normal appearance.  HENT:     Head: Normocephalic and atraumatic.     Right Ear: External ear normal.     Left Ear: External ear normal.     Nose: Congestion present.     Mouth/Throat:     Mouth: Mucous membranes are moist.     Pharynx: Oropharynx is clear.  Eyes:     Extraocular Movements: Extraocular movements intact.     Conjunctiva/sclera: Conjunctivae normal.     Pupils: Pupils are equal, round, and reactive to light.  Cardiovascular:     Rate and Rhythm: Normal rate.     Pulses: Normal pulses.      Heart sounds: Normal heart sounds.  Pulmonary:     Breath sounds: Wheezing present.  Abdominal:     General: Abdomen is flat. Bowel sounds are normal.     Palpations: Abdomen is soft.  Musculoskeletal:        General: Normal range of motion.     Cervical back: Normal range of motion.  Skin:    General: Skin is warm.     Capillary Refill: Capillary refill takes less than 2 seconds.  Neurological:     General: No focal deficit present.     Mental Status: She is alert and oriented to person, place, and time.  Psychiatric:        Mood and Affect: Mood normal.        Behavior: Behavior normal.    ED Results / Procedures / Treatments   Labs (all labs ordered are listed, but only abnormal results are displayed) Labs Reviewed  CBG MONITORING, ED - Abnormal; Notable for the following components:      Result Value   Glucose-Capillary 175 (*)    All other components within normal limits  RESP PANEL BY RT-PCR (FLU A&B, COVID) ARPGX2  I-STAT BETA HCG BLOOD, ED ([facility], [facility], [facility] ONLY)    EKG None  Radiology DG Chest 2 View  Result Date: 02/06/2022 CLINICAL DATA:  Wheezing short of breath EXAM: CHEST - 2 VIEW COMPARISON:  01/21/2020 FINDINGS: Mild bronchitic changes. No focal consolidation, pleural effusion or pneumothorax. Normal cardiomediastinal silhouette. IMPRESSION: No active cardiopulmonary disease.  Mild bronchitic changes Electronically Signed   By: 01/23/2020 M.D.   On: 02/06/2022 00:20    Procedures Procedures    Medications Ordered in ED Medications  albuterol (PROVENTIL) (2.5 MG/3ML) 0.083% nebulizer solution 5 mg (5 mg Nebulization Given 02/05/22 2336)  ipratropium (ATROVENT) nebulizer solution 0.5 mg (0.5 mg Nebulization Given 02/05/22 2335)  predniSONE (DELTASONE) tablet 60 mg (60 mg Oral Given 02/05/22 2335)  methylPREDNISolone sodium succinate (SOLU-MEDROL) 125 mg/2 mL injection 125 mg (125 mg Intravenous Given 02/06/22 0828)  ipratropium-albuterol (DUONEB) 0.5-2.5 (3)  MG/3ML nebulizer solution 3 mL (3 mLs Nebulization Given 02/06/22 0831)  chlorpheniramine-HYDROcodone 10-8 MG/5ML suspension 5 mL (5 mLs Oral Given 02/06/22 0844)  oxymetazoline (AFRIN) 0.05 % nasal spray 1 spray (1 spray Each Nare Given 02/06/22 0844)  AeroChamber Plus Flo-Vu Medium MISC 1 each (1 each Other Given 02/06/22 0830)    ED Course/ Medical Decision Making/ A&P  Medical Decision Making Risk OTC drugs. Prescription drug management.   This patient presents to the ED for concern of cough, this involves an extensive number of treatment options, and is a complaint that carries with it a high risk of complications and morbidity.  The differential diagnosis includes uri, pna, covid/flu, asthma exac   Co morbidities that complicate the patient evaluation  Asthma, dm   Additional history obtained:  Additional history obtained from epic chart review    Lab Tests:  I Ordered, and personally interpreted labs.  The pertinent results include:  covid/flu neg.  CBG slightly elevated at 175   Imaging Studies ordered:  I ordered imaging studies including cxr  I independently visualized and interpreted imaging which showed neg I agree with the radiologist interpretation   Cardiac Monitoring:  The patient was maintained on a cardiac monitor.  I personally viewed and interpreted the cardiac monitored which showed an underlying rhythm of: nsr   Medicines ordered and prescription drug management:  I ordered medication including duoneb, tussionex, solumedrol  for sob  Reevaluation of the patient after these medicines showed that the patient improved I have reviewed the patients home medicines and have made adjustments as needed    Critical Interventions:  Duoneb/steroids    Problem List / ED Course:  Asthma exacerbation:  Sob better after meds.  Pt is also exposed to fumes at work.  I will write a note to see if she can work in a place without fumes.   Pt given a spacer to go with her inhaler and instructed on how to use it. Hyperglycemia:  BS is elevated at 175.  She just finished a course of steroids.  She is instructed to establish care with a pcp and get that rechecked.   Reevaluation:  After the interventions noted above, I reevaluated the patient and found that they have :improved   Social Determinants of Health:  Spanish speaker.  No pcp.  Will instruct to f/u with [facility]   Dispostion:  After consideration of the diagnostic results and the patients response to treatment, I feel that the patent would benefit from discharge with outpatient f/u.             Final Clinical Impression(s) / ED Diagnoses Final diagnoses:  Mild intermittent asthma with exacerbation  Viral upper respiratory tract infection    Rx / DC Orders ED Discharge Orders          Ordered    loratadine (CLARITIN) 10 MG tablet  Daily PRN        02/06/22 0928    guaiFENesin (ROBITUSSIN) 100 MG/5ML liquid  Every 4 hours PRN        02/06/22 0928    ibuprofen (ADVIL) 600 MG tablet  Every 6 hours PRN        02/06/22 8299              Jacalyn Lefevre, MD 02/06/22 [phone]

## 2022-04-04 ENCOUNTER — Ambulatory Visit (HOSPITAL_COMMUNITY)
Admission: EM | Admit: 2022-04-04 | Discharge: 2022-04-04 | Disposition: A | Discharge status: Home / Self Care | Payer: Self-pay | Attending: Emergency Medicine | Admitting: Emergency Medicine

## 2022-04-04 ENCOUNTER — Encounter (HOSPITAL_COMMUNITY): Payer: Self-pay | Admitting: Emergency Medicine

## 2022-04-04 DIAGNOSIS — M25561 Pain in right knee: Secondary | ICD-10-CM

## 2022-04-04 DIAGNOSIS — J4521 Mild intermittent asthma with (acute) exacerbation: Secondary | ICD-10-CM

## 2022-04-04 MED ORDER — PREDNISONE 20 MG PO TABS: 40.0000 mg | ORAL_TABLET | Freq: Every day | ORAL | 0 refills | Status: DC

## 2022-04-04 MED ORDER — IPRATROPIUM-ALBUTEROL 20-100 MCG/ACT IN AERS: 1.0000 | INHALATION_SPRAY | Freq: Four times a day (QID) | RESPIRATORY_TRACT | 1 refills | Status: AC

## 2022-04-04 NOTE — ED Provider Notes (Signed)
?[facility] ? ? ? ?CSN: [id number] ?Arrival date & time: 04/04/22  1050 ? ? ?  ? ?History   ?Chief Complaint ?Chief Complaint  ?Patient presents with  ? Knee Pain  ? Asthma  ? ? ?HPI ?Sara Schwartz is a 46 y.o. female.  ? ?Patient presents with right knee pain beginning 4 days ago without precipitating event, trauma or injury.  Symptoms started when she was sitting down and went to change positions, fell and instant pain and symptoms have persisted since.  Pain is felt when bearing weight, worsened by walking or standing.  No pain can be felt with sitting and with flexion of the knee.  Denies numbness, tingling.  Has attempted use of ibuprofen which has been ineffective.   ? ?Patient concerned with wheezing to the chest for 3 to 4 days.  Symptoms worsened at nighttime.  Has attempted use of albuterol inhaler treatment.  History of asthma denies chest pain or tightness, shortness of breath, fever, chills, URI symptoms.   ? ?Past Medical History:  ?Diagnosis Date  ? Asthma   ? Diabetes mellitus without complication (HCC)   ? Gestational diabetes   ? ? ?Patient Active Problem List  ? Diagnosis Date Noted  ? DUB (dysfunctional uterine bleeding) 08/07/2018  ? Symptomatic anemia 07/08/2018  ? 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 Seasonal allergies 06/10/2015  ? Language barrier 01/12/2015  ? Asthma 01/10/2015  ? Dysplasia of cervix, low grade (CIN 1) 01/10/2015  ? Vaginal bleeding   ? ? ?Past Surgical History:  ?Procedure Laterality Date  ? NO PAST SURGERIES    ? VAGINAL DELIVERY    ? multiple  ? ? ?OB History   ? ? Gravida  ?7  ? Para  ?7  ? Term  ?4  ? Preterm  ?3  ? AB  ?0  ? Living  ?7  ?  ? ? SAB  ?0  ? IAB  ?0  ? Ectopic  ?0  ? Multiple  ?0  ? Live Births  ?7  ?   ?  ?  ? ? ? ?Home Medications   ? ?Prior to Admission medications   ?Medication Sig Start Date End Date Taking? Authorizing Provider  ?albuterol (VENTOLIN HFA) 108 (90 Base) MCG/ACT inhaler Inhale 1-2 puffs into the lungs  every 6 (six) hours as needed for wheezing or shortness of breath. 01/29/22   Mardella Layman, MD  ?aspirin EC 81 MG tablet Take 81 mg by mouth daily.    [provider]  ?azelastine (ASTELIN) 0.1 % nasal spray Place 2 sprays into both nostrils 2 (two) times daily. Use in each nostril as directed 02/27/21   Georgetta Haber, NP  ?cetirizine (ZYRTEC ALLERGY) 10 MG tablet Take 1 tablet (10 mg total) by mouth daily. 02/27/21   Georgetta Haber, NP  ?dextromethorphan-guaiFENesin (MUCINEX DM) 30-600 MG 12hr tablet Take 1 tablet by mouth 2 (two) times daily. 04/03/21   Wieters, Hallie C, PA-C  ?fluticasone (FLONASE) 50 MCG/ACT nasal spray Place 1-2 sprays into both nostrils daily. 04/03/21   Wieters, Hallie C, PA-C  ?guaiFENesin (ROBITUSSIN) 100 MG/5ML liquid Take 5-10 mLs (100-200 mg total) by mouth every 4 (four) hours as needed for cough or to loosen phlegm. 02/06/22   Jacalyn Lefevre, MD  ?ibuprofen (ADVIL) 600 MG tablet Take 1 tablet (600 mg total) by mouth every 6 (six) hours as needed. 02/06/22   Jacalyn Lefevre, MD  ?loratadine (CLARITIN) 10 MG tablet Take  1 tablet (10 mg total) by mouth daily as needed for allergies. 02/06/22   Isla Pence, MD  ?predniSONE (DELTASONE) 20 MG tablet Take 2 tablets (40 mg total) by mouth daily. ?Patient not taking: Reported on 02/06/2022 01/29/22   Vanessa Kick, MD  ?promethazine-dextromethorphan (PROMETHAZINE-DM) 6.25-15 MG/5ML syrup Take 5 mLs by mouth 3 (three) times daily as needed for cough. 04/21/21   Scot Jun, FNP  ?ferrous sulfate 325 (65 FE) MG tablet Take 1 tablet (325 mg total) by mouth 2 (two) times daily with a meal. ?Patient not taking: No sig reported 07/09/18 04/03/21  Woodroe Mode, MD  ? ? ?Family History ?Family History  ?Problem Relation Age of Onset  ? Diabetes Mother   ? Hypertension Mother   ? Diabetes Father   ? Hypertension Father   ? ? ?Social History ?Social History  ? ?Tobacco Use  ? Smoking status: Former  ?  Types: Cigarettes  ?  Quit date: 02/01/2015   ?  Years since quitting: 7.1  ? Smokeless tobacco: Never  ?Vaping Use  ? Vaping Use: Never used  ?Substance Use Topics  ? Alcohol use: Yes  ?  Comment: socially when not pregnant  ? Drug use: No  ? ? ? ?Allergies   ?Patient has no known allergies. ? ? ?Review of Systems ?Review of Systems ?Defer to HPI  ? ? ?Physical Exam ?Triage Vital Signs ?ED Triage Vitals  ?Enc Vitals Group  ?   BP 04/04/22 1219 137/83  ?   Pulse Rate 04/04/22 1219 76  ?   Resp 04/04/22 1219 18  ?   Temp 04/04/22 1219 98.4 ?F (36.9 ?C)  ?   Temp Source 04/04/22 1219 Oral  ?   SpO2 04/04/22 1219 99 %  ?   Weight --   ?   Height --   ?   Head Circumference --   ?   Peak Flow --   ?   Pain Score 04/04/22 1216 5  ?   Pain Loc --   ?   Pain Edu? --   ?   Excl. in [place]? --   ? ?No data found. ? ?Updated Vital Signs ?BP 137/83 (BP Location: Left Arm)   Pulse 76   Temp 98.4 ?F (36.9 ?C) (Oral)   Resp 18   LMP 03/26/2022   SpO2 99%  ? ?Visual Acuity ?Right Eye Distance:   ?Left Eye Distance:   ?Bilateral Distance:   ? ?Right Eye Near:   ?Left Eye Near:    ?Bilateral Near:    ? ?Physical Exam ?Constitutional:   ?   Appearance: Normal appearance.  ?HENT:  ?   Head: Normocephalic.  ?Eyes:  ?   Extraocular Movements: Extraocular movements intact.  ?Cardiovascular:  ?   Rate and Rhythm: Normal rate and regular rhythm.  ?   Pulses: Normal pulses.  ?   Heart sounds: Normal heart sounds.  ?Pulmonary:  ?   Effort: Pulmonary effort is normal.  ?   Breath sounds: Wheezing present.  ?Musculoskeletal:  ?   Comments: Mild swelling and tenderness along the joint space of the right knee without effusion, no point tenderness noted,, no ligament laxity noted, 2+ popliteal pulse, able to bear weight as she walked to the exam room without assistance  ?Skin: ?   General: Skin is warm and dry.  ?Neurological:  ?   Mental Status: She is alert and oriented to person, place, and time. Mental status is at baseline.  ?  Psychiatric:     ?   Mood and Affect: Mood normal.     ?    Behavior: Behavior normal.  ? ? ? ?UC Treatments / Results  ?Labs ?(all labs ordered are listed, but only abnormal results are displayed) ?Labs Reviewed - No data to display ? ?EKG ? ? ?Radiology ?No results found. ? ?Procedures ?Procedures (including critical care time) ? ?Medications Ordered in UC ?Medications - No data to display ? ?Initial Impression / Assessment and Plan / UC Course  ?I have reviewed the triage vital signs and the nursing notes. ? ?Pertinent labs & imaging results that were available during my care of the patient were reviewed by me and considered in my medical decision making (see chart for details). ? ?Acute pain of the right knee ?Mild intermittent asthma with exacerbation ? ?Etiology of knee pain is likely irritation to the ligament or tendon, discussed with patient, low suspicion of injury to the bone as lacking injury, will defer imaging today, wheezing heard to auscultation, O2 saturation 99% on room air, low suspicion for pneumonia, bronchitis or pneumothorax, will defer imaging, prednisone 40 mg burst prescribed for treatment of both the knee and lungs, advise discontinuation of albuterol inhaler peripheral as prescribed, for further management, recommended RICE, heat and rest ,given walking referral to orthopedics and pulmonology for persisting symptoms, work note given ?Final Clinical Impressions(s) / UC Diagnoses  ? ?Final diagnoses:  ?None  ? ?Discharge Instructions   ?None ?  ? ?ED Prescriptions   ?None ?  ? ?PDMP not reviewed this encounter. ?  ?Hans Eden, NP ?04/04/22 1355 ? ?

## 2022-04-04 NOTE — ED Triage Notes (Signed)
Since Sunday having right knee pain. Denies swelling or injury. ?Pt reports has "snoring in her chest and have asthma".   ?

## 2022-04-04 NOTE — Discharge Instructions (Signed)
For you knee ? ?Your pain is most likely caused by irritation to the ligaments.  ? ?Take prednisone every morning with food for the next 5 days, this medication will help with your knee pain as well as your lungs, you may take 500 to 1000 mg of Tylenol every 6 hours for additional comfort ? ?You may use heating pad in 15 minute intervals as needed for additional comfort or you may find comfort in using ice in 10-15 minutes over affected area ? ?Begin stretching affected area daily for 10 minutes as tolerated to further loosen muscles  ? ?When lying down place pillow underneath and between knees for support ? ?If pain persist after recommended treatment or reoccurs if may be beneficial to follow up with orthopedic specialist for evaluation, this doctor specializes in the bones and can manage your symptoms long-term with options such as but not limited to imaging, medications or physical therapy  ? ?For you lungs ? ?I am able to hear wheezing within your lungs ? ?The prednisone will help to reduce inflammation and ideally resolve these symptoms ? ?Your inhaler has been changed to 1 that has 2 medications ideally to help provide you more relief, you may take 1 puff of this inhaler every 6 hours as needed ? ?You have been given information to pulmonology, if you continue to have asthmatic flares you may follow-up with them for further evaluation and management ?  ?

## 2022-07-10 ENCOUNTER — Ambulatory Visit: Payer: Self-pay | Admitting: Internal Medicine

## 2023-01-17 DIAGNOSIS — R0600 Dyspnea, unspecified: Secondary | ICD-10-CM | POA: Insufficient documentation

## 2023-01-17 DIAGNOSIS — J189 Pneumonia, unspecified organism: Secondary | ICD-10-CM | POA: Insufficient documentation

## 2023-09-10 ENCOUNTER — Encounter (HOSPITAL_COMMUNITY): Payer: Self-pay

## 2023-09-10 ENCOUNTER — Ambulatory Visit (HOSPITAL_COMMUNITY)
Admission: EM | Admit: 2023-09-10 | Discharge: 2023-09-10 | Disposition: A | Discharge status: Home / Self Care | Payer: Self-pay | Attending: Internal Medicine | Admitting: Internal Medicine

## 2023-09-10 DIAGNOSIS — G8929 Other chronic pain: Secondary | ICD-10-CM

## 2023-09-10 DIAGNOSIS — M25561 Pain in right knee: Secondary | ICD-10-CM

## 2023-09-10 DIAGNOSIS — M25562 Pain in left knee: Secondary | ICD-10-CM

## 2023-09-10 MED ORDER — MELOXICAM 15 MG PO TABS: 15.0000 mg | ORAL_TABLET | Freq: Every day | ORAL | 0 refills | Status: AC

## 2023-09-10 NOTE — ED Triage Notes (Signed)
Patient here today with c/o bilat knee pain X 6 weeks. Increased pain with weightbearing. She has been taking Tylenol and IBU with some relief in the beginning but now it has not been helping. Pain has been worsening.

## 2023-09-10 NOTE — ED Provider Notes (Signed)
[facility]    CSN: [id number] Arrival date & time: 09/10/23  0913      History   Chief Complaint Chief Complaint  Patient presents with   Knee Pain    HPI Sara Schwartz is a 47 y.o. female.    Knee Pain Associated symptoms: no fever   Bilateral knee pain for more than 8 weeks present left knee then moved to right, pain is dull, constant, worse when she is standing.  She works in [employer] and stands 8 hours a day 6 days a week.  Swelling, redness, warmth, injury.  Had similar pain a year ago in the past did not seek medical attention.  Taken ibuprofen without relief.  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 Authorizing Provider  meloxicam (MOBIC) 15 MG tablet Take 1 tablet (15 mg total) by mouth daily for 14 days. 09/10/23 09/24/23 Yes Meliton Rattan, PA  albuterol (VENTOLIN HFA) 108 (90 Base) MCG/ACT inhaler Inhale 1-2 puffs into the lungs every 6 (six) hours as needed for wheezing or shortness of breath. 01/29/22   Mardella Layman, MD  ibuprofen (ADVIL) 600 MG tablet Take 1 tablet (600 mg total) by mouth every 6 (six) hours as needed. 02/06/22    Jacalyn Lefevre, MD  Ipratropium-Albuterol (COMBIVENT) 20-100 MCG/ACT AERS respimat Inhale 1 puff into the lungs every 6 (six) hours. 04/04/22   Valinda Hoar, NP  ferrous sulfate 325 (65 FE) MG tablet Take 1 tablet (325 mg total) by mouth 2 (two) times daily with a meal. Patient not taking: No sig reported 07/09/18 04/03/21  Adam Phenix, MD    Family History Family History  Problem Relation Age of Onset   Diabetes Mother    Hypertension Mother    Diabetes Father    Hypertension Father     Social History Social History   Tobacco Use   Smoking status: Former    Current packs/day: 0.00    Types: Cigarettes    Quit date: 02/01/2015    Years since quitting: 8.6   Smokeless tobacco: Never  Vaping Use   Vaping status: Never Used  Substance Use Topics   Alcohol use: Yes    Comment: socially when not pregnant   Drug use: No     Allergies   Patient has no known allergies.   Review  of Systems Review of Systems  Constitutional:  Negative for chills and fever.  Respiratory:  Negative for cough.   Musculoskeletal:  Positive for arthralgias. Negative for gait problem and joint swelling.  Skin:  Negative for color change and rash.     Physical Exam Triage Vital Signs ED Triage Vitals  Encounter Vitals Group     BP 09/10/23 0952 (!) 158/87     Systolic BP Percentile --      Diastolic BP Percentile --      Pulse Rate 09/10/23 0952 84     Resp 09/10/23 0952 16     Temp 09/10/23 0952 98.7 F (37.1 C)     Temp Source 09/10/23 0952 Oral     SpO2 09/10/23 0952 97 %     Weight 09/10/23 0952 230 lb (104.3 kg)     Height --      Head Circumference --      Peak Flow --      Pain Score 09/10/23 0951 9     Pain Loc --      Pain Education --      Exclude from Growth Chart --    No data found.  Updated Vital Signs BP (!) 158/87 (BP Location: Right Arm)   Pulse 84   Temp 98.7 F (37.1 C) (Oral)   Resp 16   Wt 230 lb (104.3 kg)   LMP 08/20/2023 (Approximate)   SpO2  97%   BMI 39.48 kg/m   Visual Acuity Right Eye Distance:   Left Eye Distance:   Bilateral Distance:    Right Eye Near:   Left Eye Near:    Bilateral Near:     Physical Exam Vitals and nursing note reviewed.  Constitutional:      Appearance: She is obese. She is not ill-appearing.  HENT:     Head: Normocephalic.  Musculoskeletal:        General: No swelling, tenderness or deformity. Normal range of motion.     Right lower leg: No edema.     Left lower leg: No edema.  Skin:    General: Skin is warm and dry.     Findings: No erythema.  Neurological:     Mental Status: She is alert and oriented to person, place, and time.      UC Treatments / Results  Labs (all labs ordered are listed, but only abnormal results are displayed) Labs Reviewed - No data to display  EKG   Radiology No results found.  Procedures Procedures (including critical care time)  Medications Ordered in UC Medications - No data to display  Initial Impression / Assessment and Plan / UC Course  I have reviewed the triage vital signs and the nursing notes.  Pertinent labs & imaging results that were available during my care of the patient were reviewed by me and considered in my medical decision making (see chart for details).     Bilateral knee pain, no injury, normal exam We will try course of meloxicam recommend she establish with primary care doctor for further evaluation and treatment  Final Clinical Impressions(s) / UC Diagnoses   Final diagnoses:  Chronic pain of both knees     Discharge Instructions      Do not take ibuprofen if taking meloxicam     ED Prescriptions     Medication Sig Dispense Auth. Provider   meloxicam (MOBIC) 15 MG tablet Take 1 tablet (15 mg total) by mouth daily for 14 days. 14 tablet  Meliton Rattan, PA      PDMP not reviewed this encounter.   Meliton Rattan, [place] 09/10/23 1010

## 2023-09-10 NOTE — Discharge Instructions (Addendum)
Do not take ibuprofen if taking meloxicam

## 2023-09-27 ENCOUNTER — Ambulatory Visit (HOSPITAL_COMMUNITY)
Admission: RE | Admit: 2023-09-27 | Discharge: 2023-09-27 | Disposition: A | Discharge status: Home / Self Care | Payer: Self-pay | Source: Ambulatory Visit | Attending: Nurse Practitioner | Admitting: Nurse Practitioner

## 2023-09-27 ENCOUNTER — Ambulatory Visit (INDEPENDENT_AMBULATORY_CARE_PROVIDER_SITE_OTHER): Payer: Self-pay | Admitting: Nurse Practitioner

## 2023-09-27 ENCOUNTER — Encounter: Payer: Self-pay | Admitting: Nurse Practitioner

## 2023-09-27 VITALS — BP 136/83 | HR 83 | Temp 98.8°F | Ht 61.5 in | Wt 236.8 lb

## 2023-09-27 DIAGNOSIS — M25562 Pain in left knee: Secondary | ICD-10-CM | POA: Insufficient documentation

## 2023-09-27 DIAGNOSIS — M25561 Pain in right knee: Secondary | ICD-10-CM

## 2023-09-27 DIAGNOSIS — Z23 Encounter for immunization: Secondary | ICD-10-CM

## 2023-09-27 DIAGNOSIS — G8929 Other chronic pain: Secondary | ICD-10-CM

## 2023-09-27 MED ORDER — KETOROLAC TROMETHAMINE 30 MG/ML IJ SOLN: 30.0000 mg | Freq: Once | INTRAMUSCULAR | Status: DC

## 2023-09-27 MED ORDER — KETOROLAC TROMETHAMINE 30 MG/ML IJ SOLN
30.0000 mg | Freq: Once | INTRAMUSCULAR | Status: AC
Start: 2023-09-27 — End: 2023-09-27
  Administered 2023-09-27: 30 mg via INTRAMUSCULAR

## 2023-09-27 NOTE — Progress Notes (Unsigned)
Patient states pain in knees.   Patient wants Flu vaccine.

## 2023-09-27 NOTE — Progress Notes (Unsigned)
Subjective   Patient ID: Sara Schwartz, female    DOB: 03-18-1976, 47 y.o.   MRN: [id number]  Chief Complaint  Patient presents with   New Patient (Initial Visit)    Referring provider: No ref. provider found  Sara Schwartz is a 47 y.o. female with Past Medical History: No date: Asthma No date: Diabetes mellitus without complication (HCC) No date: Gestational diabetes   HPI  Patient presents today to establish care.  Patient states that she has been having bilateral knee pain for the past couple months.  She states that the left knee hurts worse than the right.  She does work long hours standing.  She has tried support shoes with minimal relief noted.  We will give Toradol injection in office today.  Will place referral to Ortho and order x-rays today. Denies f/c/s, n/v/d, hemoptysis, PND, leg swelling Denies chest pain or edema       No Known Allergies  Immunization History  Administered Date(s) Administered   Influenza, Seasonal, Injecte, Preservative Fre 09/27/2023   Influenza-Unspecified 09/02/2014   Pneumococcal Polysaccharide-23 05/05/2015   Tdap 02/16/2015    Tobacco History: Social History   Tobacco Use  Smoking Status Former   Current packs/day: 0.00   Types: Cigarettes   Quit date: 02/01/2015   Years since quitting: 8.6  Smokeless Tobacco Never   Counseling given: Not Answered   Outpatient Encounter Medications as of 09/27/2023  Medication Sig   albuterol (VENTOLIN HFA) 108 (90 Base) MCG/ACT inhaler Inhale 1-2 puffs into the lungs every 6 (six) hours as needed for wheezing or shortness of breath.   ibuprofen (ADVIL) 600 MG tablet Take 1 tablet (600 mg total) by mouth every 6 (six) hours as needed.   Ipratropium-Albuterol (COMBIVENT) 20-100 MCG/ACT AERS respimat Inhale 1 puff into the lungs every 6 (six) hours. (Patient not taking: Reported on 09/27/2023)   [DISCONTINUED] ferrous sulfate 325 (65 FE) MG tablet Take 1 tablet (325 mg  total) by mouth 2 (two) times daily with a meal. (Patient not taking: No sig reported)   [EXPIRED] ketorolac (TORADOL) 30 MG/ML injection 30 mg    [DISCONTINUED] ketorolac (TORADOL) 30 MG/ML injection 30 mg    No facility-administered encounter medications on file as of 09/27/2023.    Review of Systems  Review of Systems  Constitutional: Negative.   HENT: Negative.    Cardiovascular: Negative.   Gastrointestinal: Negative.   Skin:        Bilateral knee pain  Allergic/Immunologic: Negative.   Neurological: Negative.   Psychiatric/Behavioral: Negative.       Objective:   BP 136/83   Pulse 83   Temp 98.8 F (37.1 C) (Oral)   Ht 5' 1.5" (1.562 m)   Wt 236 lb 12.8 oz (107.4 kg)   LMP 09/12/2023 (Approximate)   SpO2 100%   BMI 44.02 kg/m   Wt Readings from Last 5 Encounters:  09/27/23 236 lb 12.8 oz (107.4 kg)  09/10/23 230 lb (104.3 kg)  02/05/22 264 lb 8.8 oz (120 kg)  01/21/20 210 lb (95.3 kg)  09/14/18 211 lb (95.7 kg)     Physical Exam Vitals and nursing note reviewed.  Constitutional:      General: She is not in acute distress.    Appearance: She is well-developed.  Cardiovascular:     Rate and Rhythm: Normal rate and regular rhythm.  Pulmonary:     Effort: Pulmonary effort is normal.     Breath sounds: Normal breath sounds.  Musculoskeletal:  Right knee: Decreased range of motion. Tenderness present.     Left knee: Decreased range of motion. Tenderness present.  Neurological:     Mental Status: She is alert and oriented to person, place, and time.       Assessment & Plan:   Chronic pain of both knees -     DG Knee 1-2 Views Left -     DG Knee 1-2 Views Right -     Ketorolac Tromethamine -     Ambulatory referral to Orthopedics  Need for influenza vaccination -     Flu vaccine trivalent PF, 6mos and older(Flulaval,Afluria,Fluarix,Fluzone)     Return in about 3 months (around 12/28/2023).   Sara Andrew, NP 09/30/2023

## 2023-09-30 ENCOUNTER — Encounter: Payer: Self-pay | Admitting: Nurse Practitioner

## 2023-09-30 NOTE — Patient Instructions (Signed)
1. Chronic pain of both knees  - DG Knee 1-2 Views Left - DG Knee 1-2 Views Right - ketorolac (TORADOL) 30 MG/ML injection 30 mg - AMB referral to orthopedics  2. Need for influenza vaccination  - Flu vaccine trivalent PF, 6mos and older(Flulaval,Afluria,Fluarix,Fluzone)  Follow up:  Follow up in 3 months for physical

## 2023-10-01 NOTE — Telephone Encounter (Signed)
Lvm for pt with DPR detailed message was left. KH

## 2023-10-01 NOTE — Telephone Encounter (Signed)
Pt wants a call back from her x-rays    She speak spanish

## 2023-10-04 ENCOUNTER — Telehealth: Payer: Self-pay | Admitting: Physician Assistant

## 2023-10-04 NOTE — Telephone Encounter (Signed)
Mailed reminder letter and estimate payment letter to patient today

## 2023-10-15 ENCOUNTER — Ambulatory Visit (INDEPENDENT_AMBULATORY_CARE_PROVIDER_SITE_OTHER): Payer: Self-pay | Admitting: Orthopaedic Surgery

## 2023-10-15 ENCOUNTER — Encounter: Payer: Self-pay | Admitting: Physician Assistant

## 2023-10-15 DIAGNOSIS — G8929 Other chronic pain: Secondary | ICD-10-CM

## 2023-10-15 DIAGNOSIS — M25562 Pain in left knee: Secondary | ICD-10-CM

## 2023-10-15 DIAGNOSIS — M25561 Pain in right knee: Secondary | ICD-10-CM

## 2023-10-15 MED ORDER — MELOXICAM 7.5 MG PO TABS: 7.5000 mg | ORAL_TABLET | Freq: Two times a day (BID) | ORAL | 2 refills | Status: AC | PRN

## 2023-10-15 NOTE — Progress Notes (Signed)
Office Visit Note   Patient: Sara Schwartz           Date of Birth: 01/23/1976           MRN: [id number] Visit Date: 10/15/2023              Requested by: Ivonne Andrew, NP [phone] [address] [place],  [place] [address] PCP: Patient, No Pcp Per   Assessment & Plan: Visit Diagnoses:  1. 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  We talked about ways of improving pain.  Prescribed meloxicam.  Provided home exercises.  Also counseled patient on the importance of weight loss.  Questions encouraged and answered.  Follow-Up Instructions: No follow-ups on file.   Orders:  No orders of the defined types were placed in this encounter.  Meds ordered this encounter  Medications   meloxicam (MOBIC) 7.5 MG tablet    Sig: Take 1 tablet (7.5 mg total) by mouth 2 (two) times daily as needed for pain.    Dispense:  30 tablet    Refill:  2      Procedures: No procedures performed   Clinical Data: No additional findings.   Subjective: Chief Complaint  Patient presents with   Left Knee - Pain   Right Knee - Pain    HPI Patient is a 47 year old Hispanic female here with interpreter who comes in for bilateral anterior knee pain for 3 months.  Denies any injuries.  Feels crunching behind the kneecap when she flexes.  Hurts when she gets up from a seated position.  She has been taking ibuprofen. Review of Systems  Constitutional: Negative.   HENT: Negative.    Eyes: Negative.   Respiratory: Negative.    Cardiovascular: Negative.   Endocrine: Negative.   Musculoskeletal: Negative.   Neurological: Negative.   Hematological: Negative.   Psychiatric/Behavioral: Negative.    All other systems reviewed and are negative.    Objective: Vital Signs: LMP 09/12/2023 (Approximate)   Physical Exam Vitals and nursing note reviewed.  Constitutional:      Appearance: She is  well-developed.  HENT:     Head: Atraumatic.     Nose: Nose normal.  Eyes:     Extraocular Movements: Extraocular movements intact.  Cardiovascular:     Pulses: Normal pulses.  Pulmonary:     Effort: Pulmonary effort is normal.  Abdominal:     Palpations: Abdomen is soft.  Musculoskeletal:     Cervical back: Neck supple.  Skin:    General: Skin is warm.     Capillary Refill: Capillary refill takes less than 2 seconds.  Neurological:     Mental Status: She is alert. Mental status is at baseline.  Psychiatric:        Behavior: Behavior normal.        Thought Content: Thought content normal.        Judgment: Judgment normal.     Ortho Exam Exam of bilateral knees showed no joint effusion.  1+ patellofemoral crepitus with range of motion.  Normal range of motion. Specialty Comments:  No specialty comments available.  Imaging: No results found.   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   Seasonal allergies 06/10/2015   Language barrier 01/12/2015  Asthma 01/10/2015   Dysplasia of cervix, low grade (CIN 1) 01/10/2015   Vaginal bleeding    Past Medical History:  Diagnosis Date   Asthma    Diabetes mellitus without complication (HCC)    Gestational diabetes     Family History  Problem Relation Age of Onset   Diabetes Mother    Hypertension Mother    Diabetes Father    Hypertension Father     Past Surgical History:  Procedure Laterality Date   NO PAST SURGERIES     VAGINAL DELIVERY     multiple   Social History   Occupational History   Not on file  Tobacco Use   Smoking status: Former    Current packs/day: 0.00    Types: Cigarettes    Quit date: 02/01/2015    Years since quitting: 8.7   Smokeless tobacco: Never  Vaping Use   Vaping status: Never Used  Substance and Sexual Activity   Alcohol  use: Yes    Comment: socially when not pregnant   Drug use: No   Sexual activity: Not Currently    Birth control/protection: None

## 2023-10-28 ENCOUNTER — Ambulatory Visit: Payer: Self-pay | Admitting: Nurse Practitioner

## 2023-12-30 ENCOUNTER — Ambulatory Visit: Payer: Self-pay | Admitting: Nurse Practitioner

## 2024-04-06 ENCOUNTER — Encounter: Payer: Self-pay | Admitting: Emergency Medicine

## 2024-04-06 ENCOUNTER — Ambulatory Visit
Admission: EM | Admit: 2024-04-06 | Discharge: 2024-04-06 | Disposition: A | Discharge status: Home / Self Care | Payer: Self-pay

## 2024-04-06 DIAGNOSIS — J45901 Unspecified asthma with (acute) exacerbation: Secondary | ICD-10-CM

## 2024-04-06 MED ORDER — IPRATROPIUM-ALBUTEROL 0.5-2.5 (3) MG/3ML IN SOLN
3.0000 mL | Freq: Once | RESPIRATORY_TRACT | Status: AC
  Administered 2024-04-06: 3 mL via RESPIRATORY_TRACT

## 2024-04-06 MED ORDER — PREDNISONE 20 MG PO TABS: 40.0000 mg | ORAL_TABLET | Freq: Every day | ORAL | 0 refills | Status: AC

## 2024-04-06 NOTE — ED Triage Notes (Addendum)
 Pt reports asthma exacerbations and her albuterol  inhaler not giving relief x4 days. Pt reports she woke up Saturday morning with runny nose and sore throat which is triggering her asthma. Denies fevers. Having issues with her breathing sporadically. Pulse ox 98-100% in triage. Unlabored breathing.

## 2024-04-06 NOTE — ED Triage Notes (Signed)
 Assessed pt in lobby area. Pulse oximetry 98-99% with regular, unlabored breathing. Respirations: 18.

## 2024-04-07 ENCOUNTER — Encounter: Payer: Self-pay | Admitting: Physician Assistant

## 2024-04-07 NOTE — ED Provider Notes (Signed)
 [facility]    CSN: [id number] Arrival date & time: 04/06/24  1009      History   Chief Complaint Chief Complaint  Patient presents with   Asthma Exacerbation    HPI Sara Schwartz is a 48 y.o. female.   Patient presents today for suspected asthma exacerbation. She reports that she developed some congestion several days ago and congestion and shortness of breath have progressively worsened. She has been using her albuterol  inhaler more frequently without resolution. She has not had fever.   The history is provided by the patient.    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 Authorizing Provider  albuterol  (VENTOLIN  HFA) 108 (90 Base) MCG/ACT inhaler Inhale 1-2 puffs into the lungs every 6 (six) hours as needed for wheezing or shortness of breath. 01/29/22  Yes Afton Albright, MD  ibuprofen  (ADVIL ) 600 MG tablet Take 1 tablet (600 mg total) by mouth every 6 (six) hours as needed. 02/06/22  Yes Sueellen Emery, MD  meloxicam  (MOBIC ) 7.5 MG tablet Take 1 tablet (7.5 mg total) by mouth 2 (two) times daily  as needed for pain. 10/15/23  Yes Wes Hamman, MD  predniSONE  (DELTASONE ) 20 MG tablet Take 2 tablets (40 mg total) by mouth daily with breakfast for 5 days. 04/06/24 04/11/24 Yes Vernestine Gondola, PA-C  azelastine  (ASTELIN ) 0.1 % nasal spray Place into the nose. Patient not taking: Reported on 04/06/2024 02/27/21   [provider]  Ipratropium-Albuterol  (COMBIVENT) 20-100 MCG/ACT AERS respimat Inhale 1 puff into the lungs every 6 (six) hours. Patient not taking: Reported on 04/06/2024 04/04/22   Reena Canning, NP  ferrous sulfate  325 (65 FE) MG tablet Take 1 tablet (325 mg total) by mouth 2 (two) times daily with a meal. Patient not taking: No sig reported 07/09/18 04/03/21  Tresia Fruit, MD    Family History Family History  Problem Relation Age of Onset   Diabetes Mother    Hypertension Mother    Diabetes Father    Hypertension Father     Social History Social History   Tobacco Use   Smoking status: Former    Current packs/day: 0.00    Types: Cigarettes    Quit date: 02/01/2015    Years since quitting: 9.1  Smokeless tobacco: Never  Vaping Use   Vaping status: Never Used  Substance Use Topics   Alcohol use: Yes    Comment: socially when not pregnant   Drug use: No     Allergies   Patient has no known allergies.   Review of Systems Review of Systems  Constitutional:  Negative for chills and fever.  HENT:  Positive for congestion. Negative for ear pain and sore throat.   Eyes:  Negative for discharge and redness.  Respiratory:  Positive for cough, shortness of breath and wheezing.   Gastrointestinal:  Negative for abdominal pain, diarrhea, nausea and vomiting.     Physical Exam Triage Vital Signs ED Triage Vitals [04/06/24 1127]  Encounter Vitals Group     BP 120/70     Systolic BP Percentile      Diastolic BP Percentile      Pulse Rate 92     Resp 18     Temp 98 F (36.7 C)     Temp Source Oral     SpO2 100 %     Weight      Height      Head  Circumference      Peak Flow      Pain Score 8     Pain Loc      Pain Education      Exclude from Growth Chart    No data found.  Updated Vital Signs BP 120/70 (BP Location: Left Arm)   Pulse 92   Temp 98 F (36.7 C) (Oral)   Resp 18   LMP 03/30/2024 (Approximate)   SpO2 100%   Visual Acuity Right Eye Distance:   Left Eye Distance:   Bilateral Distance:    Right Eye Near:   Left Eye Near:    Bilateral Near:     Physical Exam Vitals and nursing note reviewed.  Constitutional:      General: She is not in acute distress.    Appearance: Normal appearance. She is not ill-appearing.  HENT:     Head: Normocephalic and atraumatic.     Right Ear: Tympanic membrane normal.     Left Ear: Tympanic membrane normal.     Nose: Congestion present.     Mouth/Throat:     Mouth: Mucous membranes are moist.     Pharynx: No oropharyngeal exudate or posterior oropharyngeal erythema.  Eyes:     Conjunctiva/sclera: Conjunctivae normal.  Cardiovascular:     Rate and Rhythm: Normal rate and regular rhythm.     Heart sounds: Normal heart sounds. No murmur heard. Pulmonary:     Effort: Pulmonary effort is normal. No respiratory distress.     Breath sounds: Wheezing (mild scattered) present. No rhonchi or rales.  Skin:    General: Skin is warm and dry.  Neurological:     Mental Status: She is alert.  Psychiatric:        Mood and Affect: Mood normal.        Thought Content: Thought content normal.      UC Treatments / Results  Labs (all labs ordered are listed, but only abnormal results are displayed) Labs Reviewed - No data to display  EKG   Radiology No results found.  Procedures Procedures (including critical care time)  Medications Ordered in UC Medications  ipratropium-albuterol  (DUONEB) 0.5-2.5 (3) MG/3ML nebulizer solution 3 mL (3 mLs Nebulization Given 04/06/24 1156)    Initial Impression / Assessment and Plan / UC Course  I have reviewed  the triage vital signs  and the nursing notes.  Pertinent labs & imaging results that were available during my care of the patient were reviewed by me and considered in my medical decision making (see chart for details).    Duoneb administered in office and will treat with steroid burst for treatment of asthma exacerbation. Continue albuterol  as needed. Encouraged follow up if no gradual improvement or with any further concerns.   Final Clinical Impressions(s) / UC Diagnoses   Final diagnoses:  Asthma with acute exacerbation, unspecified asthma severity, unspecified whether persistent   Discharge Instructions   None    ED Prescriptions     Medication Sig Dispense Auth. Provider   predniSONE  (DELTASONE ) 20 MG tablet Take 2 tablets (40 mg total) by mouth daily with breakfast for 5 days. 10 tablet Vernestine Gondola, PA-C      PDMP not reviewed this encounter.   Vernestine Gondola, PA-C 04/07/24 [phone]

## 2024-10-05 ENCOUNTER — Encounter: Payer: Self-pay | Admitting: Radiology
